# Patient Record
Sex: Male | Born: 1990
Health system: Southern US, Community
[De-identification: ages and names within clinical notes are randomized; demographics above are authoritative.]

## PROBLEM LIST (undated history)

## (undated) DIAGNOSIS — J45909 Unspecified asthma, uncomplicated: Secondary | ICD-10-CM

---
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
## 2013-09-16 ENCOUNTER — Ambulatory Visit: Payer: Self-pay | Admitting: Physician Assistant

## 2014-03-28 ENCOUNTER — Emergency Department: Payer: Self-pay | Admitting: Emergency Medicine

## 2015-02-10 ENCOUNTER — Encounter: Payer: Self-pay | Admitting: *Deleted

## 2015-02-10 ENCOUNTER — Emergency Department
Admission: EM | Admit: 2015-02-10 | Discharge: 2015-02-10 | Disposition: A | Discharge status: Home / Self Care | Payer: Self-pay | Attending: Emergency Medicine | Admitting: Emergency Medicine

## 2015-02-10 DIAGNOSIS — J4521 Mild intermittent asthma with (acute) exacerbation: Secondary | ICD-10-CM | POA: Insufficient documentation

## 2015-02-10 DIAGNOSIS — Z720 Tobacco use: Secondary | ICD-10-CM | POA: Insufficient documentation

## 2015-02-10 HISTORY — DX: Unspecified asthma, uncomplicated: J45.909

## 2015-02-10 MED ORDER — PREDNISONE 10 MG PO TABS: 50.0000 mg | ORAL_TABLET | Freq: Every day | ORAL | Status: DC

## 2015-02-10 MED ORDER — IPRATROPIUM-ALBUTEROL 0.5-2.5 (3) MG/3ML IN SOLN
3.0000 mL | Freq: Once | RESPIRATORY_TRACT | Status: AC
  Administered 2015-02-10: 3 mL via RESPIRATORY_TRACT
  Filled 2015-02-10: qty 3

## 2015-02-10 MED ORDER — CETIRIZINE HCL 10 MG PO CAPS: 10.0000 mg | ORAL_CAPSULE | Freq: Every day | ORAL | Status: DC

## 2015-02-10 MED ORDER — ALBUTEROL SULFATE HFA 108 (90 BASE) MCG/ACT IN AERS: 2.0000 | INHALATION_SPRAY | Freq: Four times a day (QID) | RESPIRATORY_TRACT | Status: DC | PRN

## 2015-02-10 MED ORDER — PREDNISONE 20 MG PO TABS
60.0000 mg | ORAL_TABLET | Freq: Once | ORAL | Status: AC
  Administered 2015-02-10: 60 mg via ORAL
  Filled 2015-02-10: qty 3

## 2015-02-10 NOTE — ED Notes (Signed)
Pt is wheezing.  Began this morning.  Pt out of inhaler

## 2015-02-10 NOTE — ED Provider Notes (Signed)
[facility] Emergency Department Provider Note  ____________________________________________  Time seen: Approximately 8:10 PM  I have reviewed the triage vital signs and the nursing notes.   HISTORY  Chief Complaint Wheezing   HPI Alveria ApleyMichael A Jared Jr. is a 24 y.o. male who presents to the emergency department for evaluation of wheezing. He states the symptoms began at 2 AM and have progressively worsened throughout the day. He has a long-standing history of asthma. He does not have a primary care provider and "never has a refill on the inhalers  from the emergency department."He states the change of season typically brings on these symptoms. He denies pain in his chest.   Past Medical History  Diagnosis Date  . Asthma     There are no active problems to display for this patient.   No past surgical history on file.  Current Outpatient Rx  Name  Route  Sig  Dispense  Refill  . albuterol (PROVENTIL HFA;VENTOLIN HFA) 108 (90 BASE) MCG/ACT inhaler   Inhalation   Inhale 2 puffs into the lungs every 6 (six) hours as needed for wheezing or shortness of breath.   1 Inhaler   2   . Cetirizine HCl 10 MG CAPS   Oral   Take 1 capsule (10 mg total) by mouth daily.   30 capsule   3   . predniSONE (DELTASONE) 10 MG tablet   Oral   Take 5 tablets (50 mg total) by mouth daily.   25 tablet   0     Allergies Review of patient's allergies indicates no known allergies.  History reviewed. No pertinent family history.  Social History Social History  Substance Use Topics  . Smoking status: Current Every Day Smoker    Types: Cigarettes  . Smokeless tobacco: None  . Alcohol Use: No    Review of Systems Constitutional: No fever/chills Eyes: No visual changes. ENT: No sore throat. Cardiovascular: Denies chest pain. Respiratory: Positive for shortness of breath. Positive for cough. Gastrointestinal: Negative for abdominal pain. Negative for nausea,   negative for vomiting.  Negative for diarrhea.  Genitourinary: Negative for dysuria. Musculoskeletal: Negative for body aches Skin: Negative for rash. Neurological: Negative for headaches, Negative for focal weakness or numbness.  10-point ROS otherwise negative.  ____________________________________________   PHYSICAL EXAM:  VITAL SIGNS: ED Triage Vitals  Enc Vitals Group     BP 02/10/15 1941 133/116 mmHg     Pulse Rate 02/10/15 1941 102     Resp 02/10/15 1941 24     Temp 02/10/15 1941 98.1 F (36.7 C)     Temp Source 02/10/15 1941 Oral     SpO2 02/10/15 1941 95 %     Weight 02/10/15 1941 145 lb (65.772 kg)     Height 02/10/15 1941 5\' 10"  (1.778 m)     Head Cir --      Peak Flow --      Pain Score 02/10/15 1943 4     Pain Loc --      Pain Edu? --      Excl. in GC? --     Constitutional: Alert and oriented. Well appearing and in no acute distress. Eyes: Conjunctivae are normal. PERRL. EOMI. Head: Atraumatic. Nose: No congestion/rhinnorhea. Mouth/Throat: Mucous membranes are moist.  Oropharynx non-erythematous. Neck: No stridor.  Lymphatic: No cervical lymphadenopathy. Cardiovascular: Normal rate, regular rhythm. Grossly normal heart sounds.  Good peripheral circulation. Respiratory: Normal respiratory effort.  No retractions. Lungs inspiratory wheezes noted in bilateral  bases, otherwise diminished throughout.. Gastrointestinal: Soft and nontender. No distention. No abdominal bruits. No CVA tenderness. Musculoskeletal: No joint pain reported. Neurologic:  Normal speech and language. No gross focal neurologic deficits are appreciated. Speech is normal. No gait instability. Skin:  Skin is warm, dry and intact. No rash noted. Psychiatric: Mood and affect are normal. Speech and behavior are normal.  ____________________________________________   LABS (all labs ordered are listed, but only abnormal results are displayed)  Labs Reviewed - No data to  display ____________________________________________  EKG   ____________________________________________  RADIOLOGY   ____________________________________________   PROCEDURES  Procedure(s) performed: None  Critical Care performed: No  ____________________________________________   INITIAL IMPRESSION / ASSESSMENT AND PLAN / ED COURSE  Pertinent labs & imaging results that were available during my care of the patient were reviewed by me and considered in my medical decision making (see chart for details).    1 DuoNeb +60 mg of prednisone given while in the emergency department. Lungs clear to auscultation on reassessment. Respirations even and unlabored.  Patient was strongly encouraged to establish primary care provider for further evaluation of asthma. He was advised to return to the emergency department for symptoms that change or worsen if he is unable schedule an appointment. ____________________________________________   FINAL CLINICAL IMPRESSION(S) / ED DIAGNOSES  Final diagnoses:  Asthma, mild intermittent, with acute exacerbation       Chinita Pester, FNP 02/10/15 2128  Jennye Moccasin, MD 02/10/15 2206

## 2015-02-10 NOTE — Discharge Instructions (Signed)
Asthma, Adult Asthma is a recurring condition in which the airways tighten and narrow. Asthma can make it difficult to breathe. It can cause coughing, wheezing, and shortness of breath. Asthma episodes, also called asthma attacks, range from minor to life-threatening. Asthma cannot be cured, but medicines and lifestyle changes can help control it. CAUSES Asthma is believed to be caused by inherited (genetic) and environmental factors, but its exact cause is unknown. Asthma may be triggered by allergens, lung infections, or irritants in the air. Asthma triggers are different for each person. Common triggers include:   Animal dander.  Dust mites.  Cockroaches.  Pollen from trees or grass.  Mold.  Smoke.  Air pollutants such as dust, household cleaners, hair sprays, aerosol sprays, paint fumes, strong chemicals, or strong odors.  Cold air, weather changes, and winds (which increase molds and pollens in the air).  Strong emotional expressions such as crying or laughing hard.  Stress.  Certain medicines (such as aspirin) or types of drugs (such as beta-blockers).  Sulfites in foods and drinks. Foods and drinks that may contain sulfites include dried fruit, potato chips, and sparkling grape juice.  Infections or inflammatory conditions such as the flu, a cold, or an inflammation of the nasal membranes (rhinitis).  Gastroesophageal reflux disease (GERD).  Exercise or strenuous activity. SYMPTOMS Symptoms may occur immediately after asthma is triggered or many hours later. Symptoms include:  Wheezing.  Excessive nighttime or early morning coughing.  Frequent or severe coughing with a common cold.  Chest tightness.  Shortness of breath. DIAGNOSIS  The diagnosis of asthma is made by a review of your medical history and a physical exam. Tests may also be performed. These may include:  Lung function studies. These tests show how much air you breathe in and out.  Allergy  tests.  Imaging tests such as X-rays. TREATMENT  Asthma cannot be cured, but it can usually be controlled. Treatment involves identifying and avoiding your asthma triggers. It also involves medicines. There are 2 classes of medicine used for asthma treatment:   Controller medicines. These prevent asthma symptoms from occurring. They are usually taken every day.  Reliever or rescue medicines. These quickly relieve asthma symptoms. They are used as needed and provide short-term relief. Your health care provider will help you create an asthma action plan. An asthma action plan is a written plan for managing and treating your asthma attacks. It includes a list of your asthma triggers and how they may be avoided. It also includes information on when medicines should be taken and when their dosage should be changed. An action plan may also involve the use of a device called a peak flow meter. A peak flow meter measures how well the lungs are working. It helps you monitor your condition. HOME CARE INSTRUCTIONS   Take medicines only as directed by your health care provider. Speak with your health care provider if you have questions about how or when to take the medicines.  Use a peak flow meter as directed by your health care provider. Record and keep track of readings.  Understand and use the action plan to help minimize or stop an asthma attack without needing to seek medical care.  Control your home environment in the following ways to help prevent asthma attacks:  Do not smoke. Avoid being exposed to secondhand smoke.  Change your heating and air conditioning filter regularly.  Limit your use of fireplaces and wood stoves.  Get rid of pests (such as roaches   and mice) and their droppings.  Throw away plants if you see mold on them.  Clean your floors and dust regularly. Use unscented cleaning products.  Try to have someone else vacuum for you regularly. Stay out of rooms while they are  being vacuumed and for a short while afterward. If you vacuum, use a dust mask from a hardware store, a double-layered or microfilter vacuum cleaner bag, or a vacuum cleaner with a HEPA filter.  Replace carpet with wood, tile, or vinyl flooring. Carpet can trap dander and dust.  Use allergy-proof pillows, mattress covers, and box spring covers.  Wash bed sheets and blankets every week in hot water and dry them in a dryer.  Use blankets that are made of polyester or cotton.  Clean bathrooms and kitchens with bleach. If possible, have someone repaint the walls in these rooms with mold-resistant paint. Keep out of the rooms that are being cleaned and painted.  Wash hands frequently. SEEK MEDICAL CARE IF:   You have wheezing, shortness of breath, or a cough even if taking medicine to prevent attacks.  The colored mucus you cough up (sputum) is thicker than usual.  Your sputum changes from clear or white to yellow, green, gray, or bloody.  You have any problems that may be related to the medicines you are taking (such as a rash, itching, swelling, or trouble breathing).  You are using a reliever medicine more than 2-3 times per week.  Your peak flow is still at 50-79% of your personal best after following your action plan for 1 hour.  You have a fever. SEEK IMMEDIATE MEDICAL CARE IF:   You seem to be getting worse and are unresponsive to treatment during an asthma attack.  You are short of breath even at rest.  You get short of breath when doing very little physical activity.  You have difficulty eating, drinking, or talking due to asthma symptoms.  You develop chest pain.  You develop a fast heartbeat.  You have a bluish color to your lips or fingernails.  You are light-headed, dizzy, or faint.  Your peak flow is less than 50% of your personal best.   This information is not intended to replace advice given to you by your health care provider. Make sure you discuss any  questions you have with your health care provider.   Document Released: 04/10/2005 Document Revised: 12/30/2014 Document Reviewed: 11/07/2012 Elsevier Interactive Patient Education 2016 Elsevier Inc.  

## 2015-10-27 ENCOUNTER — Emergency Department: Payer: BLUE CROSS/BLUE SHIELD

## 2015-10-27 ENCOUNTER — Emergency Department
Admission: EM | Admit: 2015-10-27 | Discharge: 2015-10-27 | Disposition: A | Discharge status: Home / Self Care | Payer: BLUE CROSS/BLUE SHIELD | Attending: Emergency Medicine | Admitting: Emergency Medicine

## 2015-10-27 DIAGNOSIS — J45909 Unspecified asthma, uncomplicated: Secondary | ICD-10-CM | POA: Insufficient documentation

## 2015-10-27 DIAGNOSIS — F17210 Nicotine dependence, cigarettes, uncomplicated: Secondary | ICD-10-CM | POA: Diagnosis not present

## 2015-10-27 DIAGNOSIS — J45901 Unspecified asthma with (acute) exacerbation: Secondary | ICD-10-CM

## 2015-10-27 DIAGNOSIS — Z79899 Other long term (current) drug therapy: Secondary | ICD-10-CM | POA: Diagnosis not present

## 2015-10-27 MED ORDER — PREDNISONE 20 MG PO TABS: 40.0000 mg | ORAL_TABLET | Freq: Every day | ORAL | Status: DC

## 2015-10-27 MED ORDER — IPRATROPIUM-ALBUTEROL 0.5-2.5 (3) MG/3ML IN SOLN
3.0000 mL | Freq: Once | RESPIRATORY_TRACT | Status: AC
  Administered 2015-10-27: 3 mL via RESPIRATORY_TRACT
  Filled 2015-10-27: qty 3

## 2015-10-27 MED ORDER — PREDNISONE 20 MG PO TABS
60.0000 mg | ORAL_TABLET | Freq: Once | ORAL | Status: AC
  Administered 2015-10-27: 60 mg via ORAL
  Filled 2015-10-27: qty 3

## 2015-10-27 MED ORDER — ALBUTEROL SULFATE HFA 108 (90 BASE) MCG/ACT IN AERS: 2.0000 | INHALATION_SPRAY | Freq: Four times a day (QID) | RESPIRATORY_TRACT | Status: AC | PRN

## 2015-10-27 NOTE — Discharge Instructions (Signed)
Asthma, Acute Bronchospasm °Acute bronchospasm caused by asthma is also referred to as an asthma attack. Bronchospasm means your air passages become narrowed. The narrowing is caused by inflammation and tightening of the muscles in the air tubes (bronchi) in your lungs. This can make it hard to breathe or cause you to wheeze and cough. °CAUSES °Possible triggers are: °· Animal dander from the skin, hair, or feathers of animals. °· Dust mites contained in house dust. °· Cockroaches. °· Pollen from trees or grass. °· Mold. °· Cigarette or tobacco smoke. °· Air pollutants such as dust, household cleaners, hair sprays, aerosol sprays, paint fumes, strong chemicals, or strong odors. °· Cold air or weather changes. Cold air may trigger inflammation. Winds increase molds and pollens in the air. °· Strong emotions such as crying or laughing hard. °· Stress. °· Certain medicines such as aspirin or beta-blockers. °· Sulfites in foods and drinks, such as dried fruits and wine. °· Infections or inflammatory conditions, such as a flu, cold, or inflammation of the nasal membranes (rhinitis). °· Gastroesophageal reflux disease (GERD). GERD is a condition where stomach acid backs up into your esophagus. °· Exercise or strenuous activity. °SIGNS AND SYMPTOMS  °· Wheezing. °· Excessive coughing, particularly at night. °· Chest tightness. °· Shortness of breath. °DIAGNOSIS  °Your health care provider will ask you about your medical history and perform a physical exam. A chest X-ray or blood testing may be performed to look for other causes of your symptoms or other conditions that may have triggered your asthma attack.  °TREATMENT  °Treatment is aimed at reducing inflammation and opening up the airways in your lungs.  Most asthma attacks are treated with inhaled medicines. These include quick relief or rescue medicines (such as bronchodilators) and controller medicines (such as inhaled corticosteroids). These medicines are sometimes  given through an inhaler or a nebulizer. Systemic steroid medicine taken by mouth or given through an IV tube also can be used to reduce the inflammation when an attack is moderate or severe. Antibiotic medicines are only used if a bacterial infection is present.  °HOME CARE INSTRUCTIONS  °· Rest. °· Drink plenty of liquids. This helps the mucus to remain thin and be easily coughed up. Only use caffeine in moderation and do not use alcohol until you have recovered from your illness. °· Do not smoke. Avoid being exposed to secondhand smoke. °· You play a critical role in keeping yourself in good health. Avoid exposure to things that cause you to wheeze or to have breathing problems. °· Keep your medicines up-to-date and available. Carefully follow your health care provider's treatment plan. °· Take your medicine exactly as prescribed. °· When pollen or pollution is bad, keep windows closed and use an air conditioner or go to places with air conditioning. °· Asthma requires careful medical care. See your health care provider for a follow-up as advised. If you are more than [redacted] weeks pregnant and you were prescribed any new medicines, let your obstetrician know about the visit and how you are doing. Follow up with your health care provider as directed. °· After you have recovered from your asthma attack, make an appointment with your outpatient doctor to talk about ways to reduce the likelihood of future attacks. If you do not have a doctor who manages your asthma, make an appointment with a primary care doctor to discuss your asthma. °SEEK IMMEDIATE MEDICAL CARE IF:  °· You are getting worse. °· You have trouble breathing. If severe, call your local   emergency services (911 in the [place]  You develop chest pain or discomfort.  You are vomiting.  You are not able to keep fluids down.  You are coughing up yellow, green, brown, or bloody sputum.  You have a fever and your symptoms suddenly get worse.  You have  trouble swallowing. MAKE SURE YOU:   Understand these instructions.  Will watch your condition.  Will get help right away if you are not doing well or get worse.   This information is not intended to replace advice given to you by your health care provider. Make sure you discuss any questions you have with your health care provider.   Document Released: 07/26/2006 Document Revised: 04/15/2013 Document Reviewed: 10/16/2012 Elsevier Interactive Patient Education [employer]2016 Elsevier Inc.  Please return immediately if condition worsens. Please contact her primary physician or the physician you were given for referral. If you have any specialist physicians involved in her treatment and plan please also contact them. Thank you for using [place] [facility].

## 2015-10-27 NOTE — ED Provider Notes (Signed)
Time Seen: Approximately 0 740*  I have reviewed the triage notes  Chief Complaint: Asthma and Emesis   History of Present Illness: Mark ApleyMichael A Sharma Jr. is a 25 y.o. male *who presents with a history of asthma. Patient normally has an inhaler at home and states he ran out several days ago. The patient also states he's developed some poison ivy and apparently was working outside and "" heart "" a tree that had poison ivy on it and describes it as being around both of his inner thigh area toward his genitalia. Patient denies any fever, chills or productive cough. He goes on to state. Couple of days ago he finished up a three-day course of nausea and vomiting. Mild diffuse chest discomfort mainly with movement. He denies any productive nature to his cough. He denies any calf tenderness or swelling or any strong pulmonary emboli risk factors.  Past Medical History  Diagnosis Date  . Asthma     There are no active problems to display for this patient.   History reviewed. No pertinent past surgical history.  History reviewed. No pertinent past surgical history.  Current Outpatient Rx  Name  Route  Sig  Dispense  Refill  . Cetirizine HCl 10 MG CAPS   Oral   Take 1 capsule (10 mg total) by mouth daily.   30 capsule   3   . albuterol (PROVENTIL HFA;VENTOLIN HFA) 108 (90 Base) MCG/ACT inhaler   Inhalation   Inhale 2 puffs into the lungs every 6 (six) hours as needed for wheezing or shortness of breath.   1 Inhaler   2   . predniSONE (DELTASONE) 20 MG tablet   Oral   Take 2 tablets (40 mg total) by mouth daily.   10 tablet   0     Allergies:  Review of patient's allergies indicates no known allergies.  Family History: No family history on file.  Social History: Social History  Substance Use Topics  . Smoking status: Current Every Day Smoker    Types: Cigarettes  . Smokeless tobacco: None  . Alcohol Use: No     Review of Systems:   10 point review of systems was  performed and was otherwise negative:  Constitutional: No fever Eyes: No visual disturbances ENT: No sore throat, ear pain Cardiac: No chest pain Respiratory: Mild shortness of breath, without wheezing, or stridor Abdomen: No abdominal pain, no vomiting, No diarrhea Endocrine: No weight loss, No night sweats Extremities: No peripheral edema, cyanosis Skin: Pruritic rash on the inner surface of both eyes Neurologic: No focal weakness, trouble with speech or swollowing Urologic: No dysuria, Hematuria, or urinary frequency   Physical Exam:  ED Triage Vitals  Enc Vitals Group     BP 10/27/15 0734 126/81 mmHg     Pulse Rate 10/27/15 0732 63     Resp 10/27/15 0732 18     Temp 10/27/15 0732 97.5 F (36.4 C)     Temp Source 10/27/15 0732 Oral     SpO2 10/27/15 0732 96 %     Weight 10/27/15 0732 150 lb (68.04 kg)     Height 10/27/15 0732 5\' 10"  (1.778 m)     Head Cir --      Peak Flow --      Pain Score 10/27/15 0733 6     Pain Loc --      Pain Edu? --      Excl. in GC? --     General: Awake ,  Alert , and Oriented times 3; GCS 15No signs respiratory distress Head: Normal cephalic , atraumatic Eyes: Pupils equal , round, reactive to light Nose/Throat: No nasal drainage, patent upper airway without erythema or exudate.  Neck: Supple, Full range of motion, No anterior adenopathy or palpable thyroid masses Lungs: Clear to ascultation mild and expiratory wheezes , no rhonchi, or rales Heart: Regular rate, regular rhythm without murmurs , gallops , or rubs Abdomen: Soft, non tender without rebound, guarding , or rigidity; bowel sounds positive and symmetric in all 4 quadrants. No organomegaly .        Extremities: 2 plus symmetric pulses. No edema, clubbing or cyanosis Neurologic: normal ambulation, Motor symmetric without deficits, sensory intact Skin: Rash consistent with poison ivy    Radiology:      DG Chest 2 View (Final result) Result time: 10/27/15 08:10:26   Final  result by Rad Results In Interface (10/27/15 08:10:26)   Narrative:   CLINICAL DATA: History of asthma, chest tightness shortness of breath and cough following an asthma attack this morning, former smoker.  EXAM: CHEST 2 VIEW  COMPARISON: Chest x-ray of March 28, 2014  FINDINGS: The lungs are mildly hyperinflated without hemidiaphragm flattening. There is no focal infiltrate. There is no pneumothorax or pleural effusion. The heart and pulmonary vascularity are normal. The mediastinum is normal in width. The bony thorax exhibits no acute abnormality. There is stable mild reverse S-shaped thoracic scoliosis.  IMPRESSION: Mild hyperinflation. No pneumonia nor other acute cardiopulmonary abnormality.   Electronically Signed By: David SwazilandJordan M.D. On: 10/27/2015 08:10         I personally reviewed the radiologic studies    ED Course: * Patient's stay was uneventful he received one DuoNeb and felt improvement and repeat exam showed very limited wheezing at this time. Pulse oximetry is stable. I felt laboratory work was not necessary. Patient was also started on steroid therapy with prednisone. He'll be prescribed prednisone on an outpatient basis. He is advised drink plenty of fluids and aligned himself with primary care on an outpatient basis.    Assessment: Contact dermatitis Acute exacerbation of chronic asthma  Final Clinical Impression: *  Final diagnoses:  Asthma attack     Plan: * Outpatient Prescription for prednisone and albuterol inhaler Patient was advised to return immediately if condition worsens. Patient was advised to follow up with their primary care physician or other specialized physicians involved in their outpatient care. The patient and/or family member/power of attorney had laboratory results reviewed at the bedside. All questions and concerns were addressed and appropriate discharge instructions were distributed by the nursing  staff.            Jennye MoccasinBrian S Ashlie Mcmenamy, MD 10/27/15 [phone]

## 2015-10-27 NOTE — ED Notes (Addendum)
Pt c/o asthma flare since yesterday. States he is out of his inhalers. States he has had a cough with congestion for the past 3 days. N/V for the past 3 days

## 2015-10-27 NOTE — ED Notes (Signed)
MD in room to re-assess patient.  Will continue to monitor.   

## 2015-12-06 IMAGING — CR DG CHEST 2V
1 series · 2 of 2 positions shown · non-contrast
Comparison: None.

CLINICAL DATA: Initial evaluation for cough, asthma for past 2
weeks, shortness of breath, no fever

EXAM:
CHEST  2 VIEW

[Series 1: dxr chest pa (or ap) and lateral · 0.14mm/px · 2 of 2 slices shown]
[im 1/2]
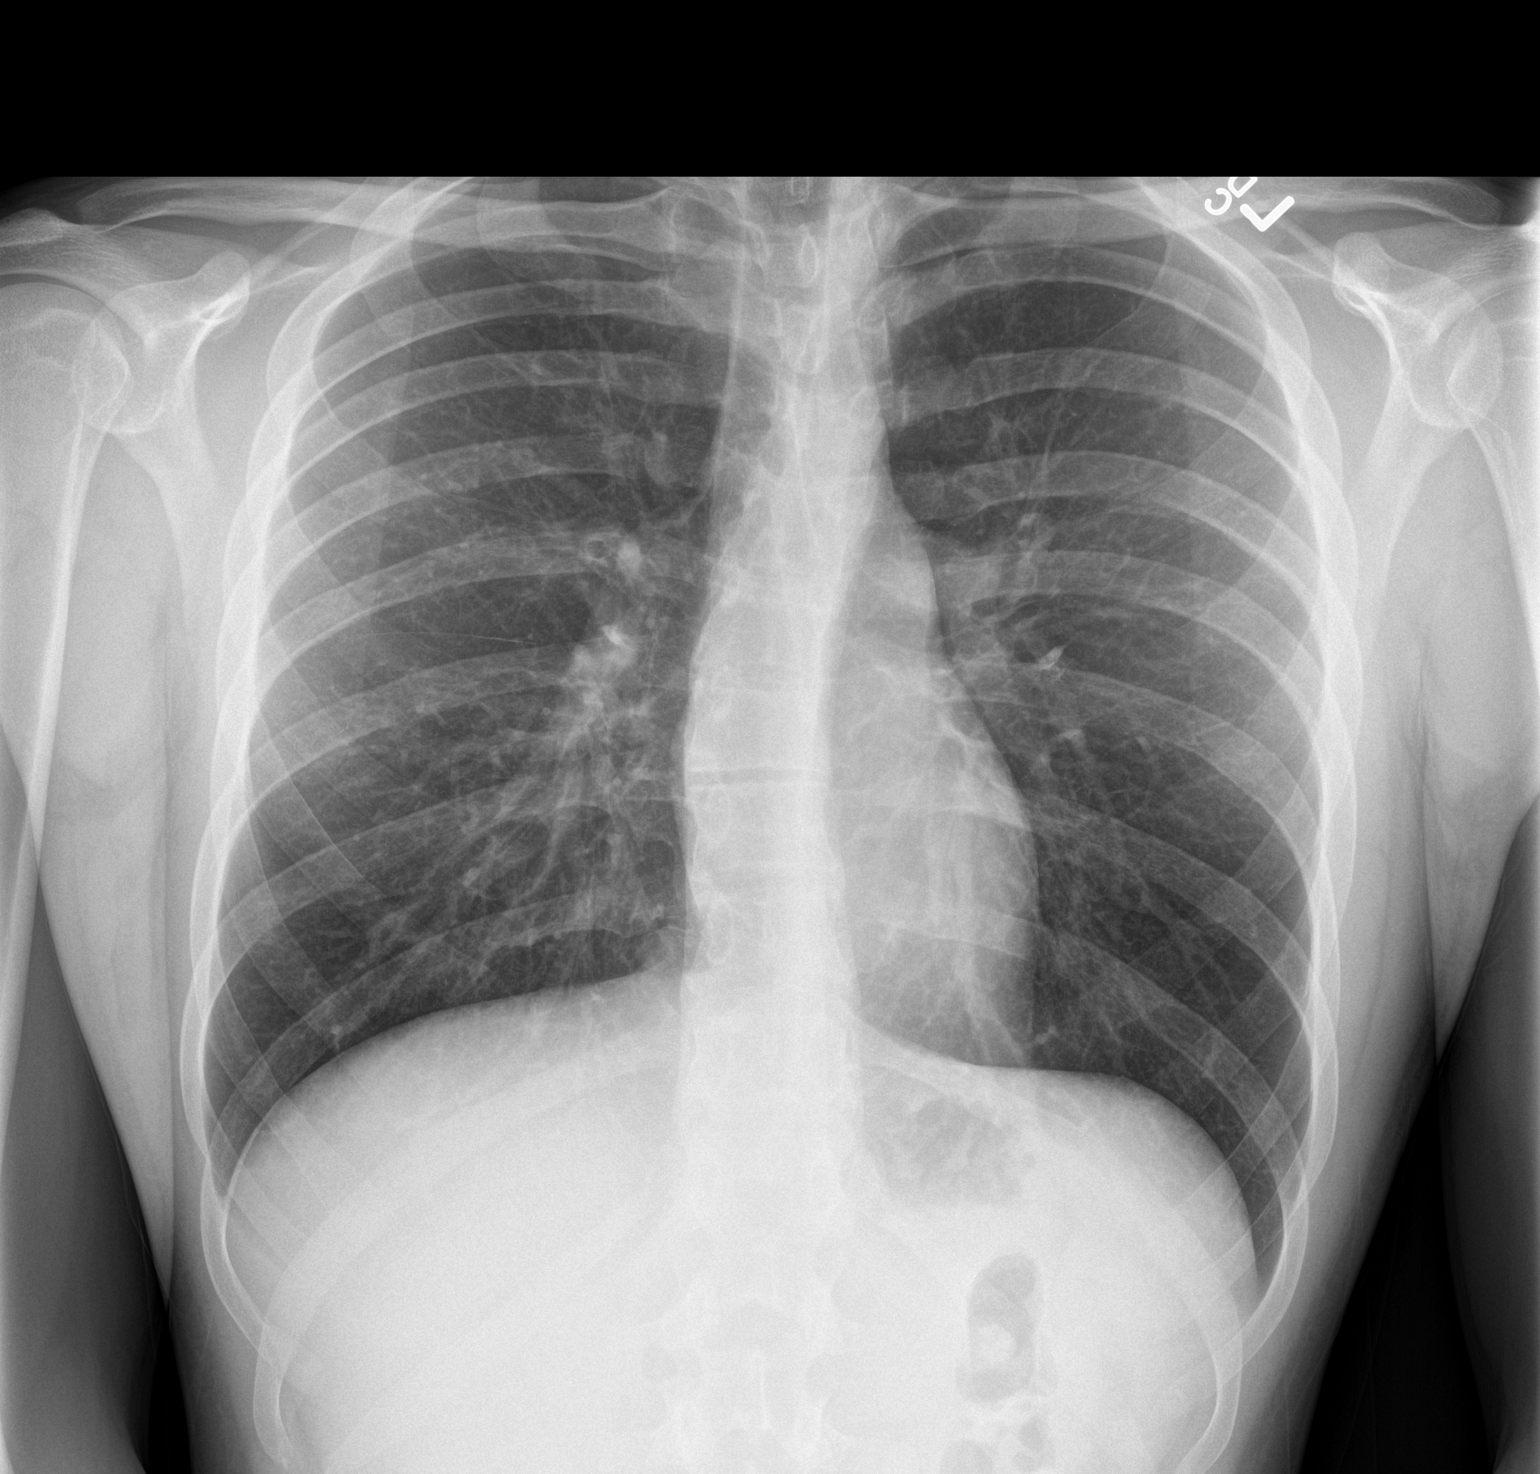
[im 2/2]
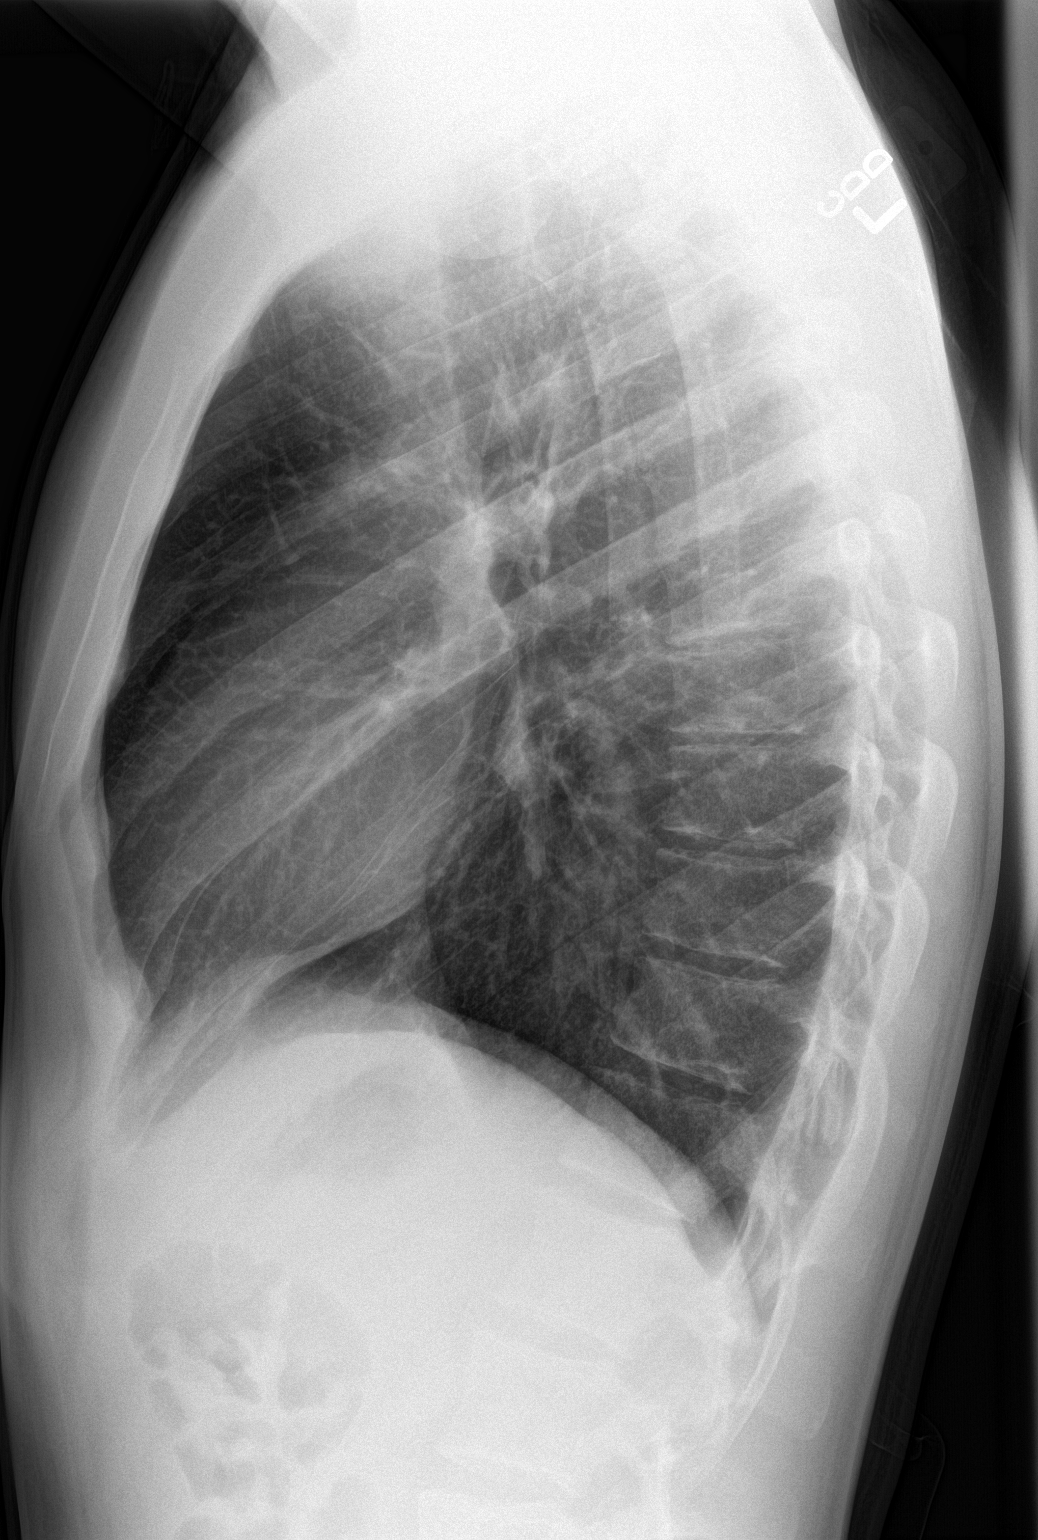

[2 of 2 positions shown; findings below may reference images not displayed]

FINDINGS: Heart size and vascular pattern normal. Lungs are clear. No
effusion. Bony thorax intact. Mild to moderate sigmoid scoliotic
curvature of the thoracic spine noted.
IMPRESSION: No active cardiopulmonary disease.

## 2015-12-31 ENCOUNTER — Encounter: Payer: Self-pay | Admitting: *Deleted

## 2015-12-31 ENCOUNTER — Emergency Department
Admission: EM | Admit: 2015-12-31 | Discharge: 2015-12-31 | Disposition: A | Discharge status: Home / Self Care | Payer: BLUE CROSS/BLUE SHIELD | Attending: Emergency Medicine | Admitting: Emergency Medicine

## 2015-12-31 ENCOUNTER — Emergency Department: Payer: BLUE CROSS/BLUE SHIELD

## 2015-12-31 DIAGNOSIS — Z5321 Procedure and treatment not carried out due to patient leaving prior to being seen by health care provider: Secondary | ICD-10-CM | POA: Diagnosis not present

## 2015-12-31 DIAGNOSIS — R062 Wheezing: Secondary | ICD-10-CM | POA: Insufficient documentation

## 2015-12-31 DIAGNOSIS — Z87891 Personal history of nicotine dependence: Secondary | ICD-10-CM | POA: Insufficient documentation

## 2015-12-31 MED ORDER — IPRATROPIUM-ALBUTEROL 0.5-2.5 (3) MG/3ML IN SOLN
3.0000 mL | Freq: Once | RESPIRATORY_TRACT | Status: AC
  Administered 2015-12-31: 3 mL via RESPIRATORY_TRACT

## 2015-12-31 MED ORDER — IPRATROPIUM-ALBUTEROL 0.5-2.5 (3) MG/3ML IN SOLN
RESPIRATORY_TRACT | Status: AC
  Filled 2015-12-31: qty 3

## 2015-12-31 NOTE — ED Notes (Signed)
Pt states breathing better after breathing treatment in triage.

## 2015-12-31 NOTE — ED Notes (Signed)
Pt not found in lobby 

## 2015-12-31 NOTE — ED Triage Notes (Signed)
Pt reports wheezing tonight.  Hx of asthma.  States out of inhaler for 3 weeks.

## 2016-05-22 ENCOUNTER — Encounter: Payer: Self-pay | Admitting: Emergency Medicine

## 2016-05-22 ENCOUNTER — Emergency Department
Admission: EM | Admit: 2016-05-22 | Discharge: 2016-05-22 | Disposition: A | Discharge status: Home / Self Care | Payer: PRIVATE HEALTH INSURANCE | Attending: Emergency Medicine | Admitting: Emergency Medicine

## 2016-05-22 ENCOUNTER — Emergency Department: Payer: PRIVATE HEALTH INSURANCE

## 2016-05-22 DIAGNOSIS — Z87891 Personal history of nicotine dependence: Secondary | ICD-10-CM | POA: Insufficient documentation

## 2016-05-22 DIAGNOSIS — J45909 Unspecified asthma, uncomplicated: Secondary | ICD-10-CM | POA: Insufficient documentation

## 2016-05-22 DIAGNOSIS — Y939 Activity, unspecified: Secondary | ICD-10-CM | POA: Insufficient documentation

## 2016-05-22 DIAGNOSIS — W2203XA Walked into furniture, initial encounter: Secondary | ICD-10-CM | POA: Insufficient documentation

## 2016-05-22 DIAGNOSIS — Y999 Unspecified external cause status: Secondary | ICD-10-CM | POA: Insufficient documentation

## 2016-05-22 DIAGNOSIS — Y929 Unspecified place or not applicable: Secondary | ICD-10-CM | POA: Insufficient documentation

## 2016-05-22 DIAGNOSIS — Z79899 Other long term (current) drug therapy: Secondary | ICD-10-CM | POA: Insufficient documentation

## 2016-05-22 DIAGNOSIS — F4310 Post-traumatic stress disorder, unspecified: Secondary | ICD-10-CM | POA: Insufficient documentation

## 2016-05-22 DIAGNOSIS — S60221A Contusion of right hand, initial encounter: Secondary | ICD-10-CM

## 2016-05-22 MED ORDER — NAPROXEN 500 MG PO TABS
500.0000 mg | ORAL_TABLET | Freq: Once | ORAL | Status: AC
  Administered 2016-05-22: 500 mg via ORAL
  Filled 2016-05-22: qty 1

## 2016-05-22 MED ORDER — NAPROXEN 500 MG PO TABS
500.0000 mg | ORAL_TABLET | Freq: Two times a day (BID) | ORAL | 0 refills | Status: DC
Start: 2016-05-22 — End: 2020-09-08

## 2016-05-22 MED ORDER — TRAMADOL HCL 50 MG PO TABS
50.0000 mg | ORAL_TABLET | Freq: Once | ORAL | Status: AC
  Administered 2016-05-22: 50 mg via ORAL
  Filled 2016-05-22: qty 1

## 2016-05-22 NOTE — ED Triage Notes (Signed)
Pt reports right hand swelling and pain, reports doing hot water soaks and taking extra strength aspirin with no relief. Swelling noted.

## 2016-05-22 NOTE — Discharge Instructions (Signed)
Wear splint for 3-5 days as needed. °

## 2016-05-22 NOTE — ED Provider Notes (Signed)
[facility] Emergency Department Provider Note   ____________________________________________   First MD Initiated Contact with Patient 05/22/16 [phone]     (approximate)  I have reviewed the triage vital signs and the nursing notes.   HISTORY  Chief Complaint Hand Injury    HPI Mark ApleyMichael A Maffei Jr. is a 26 y.o. male patient complaining of right hand pain for 1 week. Patient state he believes he hit his hand in his sleep against the bedpost.Patient state swelling has continued. Patient denies any other provocative incident for his complaint. Patient rates his pain as a 4/10. Patient state only mild relief with extra aspirin. He is right-hand dominant. Patient denies loss sensation or loss of function.   Past Medical History:  Diagnosis Date  . Asthma     There are no active problems to display for this patient.   No past surgical history on file.  Prior to Admission medications   Medication Sig Start Date End Date Taking? Authorizing Provider  albuterol (PROVENTIL HFA;VENTOLIN HFA) 108 (90 Base) MCG/ACT inhaler Inhale 2 puffs into the lungs every 6 (six) hours as needed for wheezing or shortness of breath. 10/27/15   Jennye MoccasinBrian S Quigley, MD  Cetirizine HCl 10 MG CAPS Take 1 capsule (10 mg total) by mouth daily. 02/10/15   Chinita Pesterari B Triplett, FNP  naproxen (NAPROSYN) 500 MG tablet Take 1 tablet (500 mg total) by mouth 2 (two) times daily with a meal. 05/22/16   Joni Reiningonald K Larkin Alfred, PA-C  predniSONE (DELTASONE) 20 MG tablet Take 2 tablets (40 mg total) by mouth daily. 10/27/15   Jennye MoccasinBrian S Quigley, MD    Allergies Patient has no known allergies.  No family history on file.  Social History Social History  Substance Use Topics  . Smoking status: Former Smoker    Types: Cigarettes  . Smokeless tobacco: Never Used  . Alcohol use No    Review of Systems Constitutional: No fever/chills Eyes: No visual changes. ENT: No sore throat. Cardiovascular: Denies chest  pain. Respiratory: Denies shortness of breath. Gastrointestinal: No abdominal pain.  No nausea, no vomiting.  No diarrhea.  No constipation. Genitourinary: Negative for dysuria. Musculoskeletal: Right hand pain and edema. Skin: Negative for rash. Neurological: Negative for headaches, focal weakness or numbness. Psychiatric:PTSD  ____________________________________________   PHYSICAL EXAM:  VITAL SIGNS: ED Triage Vitals  Enc Vitals Group     BP 05/22/16 0829 134/60     Pulse Rate 05/22/16 0829 80     Resp 05/22/16 0829 16     Temp 05/22/16 0829 98.6 F (37 C)     Temp Source 05/22/16 0829 Oral     SpO2 05/22/16 0829 96 %     Weight 05/22/16 0830 145 lb (65.8 kg)     Height 05/22/16 0830 5\' 9"  (1.753 m)     Head Circumference --      Peak Flow --      Pain Score 05/22/16 0818 4     Pain Loc --      Pain Edu? --      Excl. in GC? --     Constitutional: Alert and oriented. Well appearing and in no acute distress. Eyes: Conjunctivae are normal. PERRL. EOMI. Head: Atraumatic. Nose: No congestion/rhinnorhea. Mouth/Throat: Mucous membranes are moist.  Oropharynx non-erythematous. Neck: No stridor.  No cervical spine tenderness to palpation. Hematological/Lymphatic/Immunilogical: No cervical lymphadenopathy. Cardiovascular: Normal rate, regular rhythm. Grossly normal heart sounds.  Good peripheral circulation. Respiratory: Normal respiratory effort.  No retractions. Lungs  CTAB. Gastrointestinal: Soft and nontender. No distention. No abdominal bruits. No CVA tenderness. Musculoskeletal: No lower extremity tenderness nor edema.  No joint effusions. Neurologic:  Normal speech and language. No gross focal neurologic deficits are appreciated. No gait instability. Skin:  Skin is warm, dry and intact. No rash noted. Psychiatric: Mood and affect are normal. Speech and behavior are normal.  ____________________________________________   LABS (all labs ordered are listed, but only  abnormal results are displayed)  Labs Reviewed - No data to display ____________________________________________  EKG   ____________________________________________  RADIOLOGY No acute findings except for soft tissue edema.  ____________________________________________   PROCEDURES  Procedure(s) performed: None  Procedures  Critical Care performed: No  ____________________________________________   INITIAL IMPRESSION / ASSESSMENT AND PLAN / ED COURSE  Pertinent labs & imaging results that were available during my care of the patient were reviewed by me and considered in my medical decision making (see chart for details).  Right hand contusion. Patient given discharge care instructions. Patient placed in a volar hand splint. Patient given prescription for naproxen.      ____________________________________________   FINAL CLINICAL IMPRESSION(S) / ED DIAGNOSES Patient complaining right hand pain secondary to a contusion. Discussed negative x-ray findings except for soft tissue edema. Patient placed in a volar splint and given anti-inflammatory medications. Patient advised follow-up with family doctor if condition persists. Final diagnoses:  Contusion of right hand, initial encounter      NEW MEDICATIONS STARTED DURING THIS VISIT:  New Prescriptions   NAPROXEN (NAPROSYN) 500 MG TABLET    Take 1 tablet (500 mg total) by mouth 2 (two) times daily with a meal.     Note:  This document was prepared using Dragon voice recognition software and may include unintentional dictation errors.    Joni Reining, PA-C 05/22/16 1610    Jennye Moccasin, MD 05/26/16 [phone]

## 2016-05-22 NOTE — ED Notes (Signed)
See triage note  Not sure if he hit his hand during his sleep about a week ago  Right hand swollen

## 2017-06-01 ENCOUNTER — Emergency Department
Admission: EM | Admit: 2017-06-01 | Discharge: 2017-06-01 | Disposition: A | Discharge status: Home / Self Care | Payer: PRIVATE HEALTH INSURANCE | Attending: Emergency Medicine | Admitting: Emergency Medicine

## 2017-06-01 ENCOUNTER — Other Ambulatory Visit: Payer: Self-pay

## 2017-06-01 ENCOUNTER — Encounter: Payer: Self-pay | Admitting: Emergency Medicine

## 2017-06-01 DIAGNOSIS — Z79899 Other long term (current) drug therapy: Secondary | ICD-10-CM | POA: Insufficient documentation

## 2017-06-01 DIAGNOSIS — J111 Influenza due to unidentified influenza virus with other respiratory manifestations: Secondary | ICD-10-CM | POA: Insufficient documentation

## 2017-06-01 DIAGNOSIS — Z87891 Personal history of nicotine dependence: Secondary | ICD-10-CM | POA: Insufficient documentation

## 2017-06-01 DIAGNOSIS — R69 Illness, unspecified: Secondary | ICD-10-CM

## 2017-06-01 DIAGNOSIS — J45909 Unspecified asthma, uncomplicated: Secondary | ICD-10-CM | POA: Insufficient documentation

## 2017-06-01 LAB — INFLUENZA PANEL BY PCR (TYPE A & B)
INFLBPCR: NEGATIVE
Influenza A By PCR: NEGATIVE

## 2017-06-01 MED ORDER — ONDANSETRON HCL 4 MG/2ML IJ SOLN
4.0000 mg | Freq: Once | INTRAMUSCULAR | Status: AC
  Administered 2017-06-01: 4 mg via INTRAVENOUS
  Filled 2017-06-01: qty 2

## 2017-06-01 MED ORDER — SODIUM CHLORIDE 0.9 % IV BOLUS (SEPSIS)
1000.0000 mL | Freq: Once | INTRAVENOUS | Status: AC
  Administered 2017-06-01: 1000 mL via INTRAVENOUS

## 2017-06-01 MED ORDER — ONDANSETRON HCL 4 MG PO TABS: 4.0000 mg | ORAL_TABLET | Freq: Every day | ORAL | 0 refills | Status: AC | PRN

## 2017-06-01 MED ORDER — LIDOCAINE VISCOUS 2 % MT SOLN: 10.0000 mL | OROMUCOSAL | 0 refills | Status: AC | PRN

## 2017-06-01 MED ORDER — FLUTICASONE PROPIONATE 50 MCG/ACT NA SUSP: 2.0000 | Freq: Every day | NASAL | 0 refills | Status: AC

## 2017-06-01 NOTE — ED Triage Notes (Signed)
Pt with flu like sx.  

## 2017-06-01 NOTE — ED Provider Notes (Signed)
[facility] Emergency Department Provider Note  ____________________________________________  Time seen: Approximately 12:42 PM  I have reviewed the triage vital signs and the nursing notes.   HISTORY  Chief Complaint Generalized Body Aches    HPI Mark ApleyMichael A Howson Jr. is a 27 y.o. male that presents emergency department for evaluation of headache, nasal congestion, sore throat, body aches, vomiting for 2 days.  Patient states that he is having trouble keeping down liquids due to vomiting.  He has not having any abdominal pain. No sick contacts.  No cough, shortness of breath, abdominal pain, diarrhea, constipation.   Past Medical History:  Diagnosis Date  . Asthma     There are no active problems to display for this patient.   History reviewed. No pertinent surgical history.  Prior to Admission medications   Medication Sig Start Date End Date Taking? Authorizing Provider  albuterol (PROVENTIL HFA;VENTOLIN HFA) 108 (90 Base) MCG/ACT inhaler Inhale 2 puffs into the lungs every 6 (six) hours as needed for wheezing or shortness of breath. 10/27/15   Jennye MoccasinQuigley, Brian S, MD  Cetirizine HCl 10 MG CAPS Take 1 capsule (10 mg total) by mouth daily. 02/10/15   Triplett, Cari B, FNP  fluticasone (FLONASE) 50 MCG/ACT nasal spray Place 2 sprays into both nostrils daily. 06/01/17 06/01/18  Enid DerryWagner, Aireana Ryland, PA-C  lidocaine (XYLOCAINE) 2 % solution Use as directed 10 mLs in the mouth or throat as needed for mouth pain. 06/01/17   Enid DerryWagner, Jhonny Calixto, PA-C  naproxen (NAPROSYN) 500 MG tablet Take 1 tablet (500 mg total) by mouth 2 (two) times daily with a meal. 05/22/16   Joni ReiningSmith, Ronald K, PA-C  ondansetron (ZOFRAN) 4 MG tablet Take 1 tablet (4 mg total) by mouth daily as needed for nausea or vomiting. 06/01/17 06/01/18  Enid DerryWagner, Janesha Brissette, PA-C  predniSONE (DELTASONE) 20 MG tablet Take 2 tablets (40 mg total) by mouth daily. 10/27/15   Jennye MoccasinQuigley, Brian S, MD    Allergies Patient has no known  allergies.  No family history on file.  Social History Social History   Tobacco Use  . Smoking status: Former Smoker    Types: Cigarettes  . Smokeless tobacco: Never Used  Substance Use Topics  . Alcohol use: No  . Drug use: Not on file     Review of Systems  Eyes: No visual changes. No discharge. ENT: Positive for congestion and rhinorrhea. Cardiovascular: No chest pain. Respiratory: Negative for cough. No SOB. Gastrointestinal: No abdominal pain.  Positive for nausea and vomiting.  No diarrhea.  No constipation. Musculoskeletal: Positive for body aches. Skin: Negative for rash, abrasions, lacerations, ecchymosis. Neurological: Positive for headache.   ____________________________________________   PHYSICAL EXAM:  VITAL SIGNS: ED Triage Vitals  Enc Vitals Group     BP 06/01/17 1046 129/65     Pulse Rate 06/01/17 1046 92     Resp 06/01/17 1046 18     Temp 06/01/17 1046 99.5 F (37.5 C)     Temp Source 06/01/17 1046 Oral     SpO2 06/01/17 1046 95 %     Weight 06/01/17 1025 145 lb (65.8 kg)     Height --      Head Circumference --      Peak Flow --      Pain Score 06/01/17 1025 5     Pain Loc --      Pain Edu? --      Excl. in GC? --      Constitutional: Alert and oriented.  Well appearing and in no acute distress. Eyes: Conjunctivae are normal. PERRL. EOMI. No discharge. Head: Atraumatic. ENT: No frontal and maxillary sinus tenderness.      Ears: Tympanic membranes pearly gray with good landmarks. No discharge.      Nose: Mild congestion/rhinnorhea.      Mouth/Throat: Mucous membranes are moist. Oropharynx non-erythematous. Tonsils not enlarged. No exudates. Uvula midline. Neck: No stridor.   Hematological/Lymphatic/Immunilogical: No cervical lymphadenopathy. Cardiovascular: Normal rate, regular rhythm.  Good peripheral circulation. Respiratory: Normal respiratory effort without tachypnea or retractions. Lungs CTAB. Good air entry to the bases with no  decreased or absent breath sounds. Gastrointestinal: Bowel sounds 4 quadrants. Soft and nontender to palpation. No guarding or rigidity. No palpable masses. No distention. Musculoskeletal: Full range of motion to all extremities. No gross deformities appreciated. Neurologic:  Normal speech and language. No gross focal neurologic deficits are appreciated.  Skin:  Skin is warm, dry and intact. No rash noted.   ____________________________________________   LABS (all labs ordered are listed, but only abnormal results are displayed)  Labs Reviewed  INFLUENZA PANEL BY PCR (TYPE A & B)   ____________________________________________  EKG   ____________________________________________  RADIOLOGY   No results found.  ____________________________________________    PROCEDURES  Procedure(s) performed:    Procedures    Medications  sodium chloride 0.9 % bolus 1,000 mL (0 mLs Intravenous Stopped 06/01/17 1407)  ondansetron (ZOFRAN) injection 4 mg (4 mg Intravenous Given 06/01/17 1241)     ____________________________________________   INITIAL IMPRESSION / ASSESSMENT AND PLAN / ED COURSE  Pertinent labs & imaging results that were available during my care of the patient were reviewed by me and considered in my medical decision making (see chart for details).  Review of the [place] CSRS was performed in accordance of the [facility] prior to dispensing any controlled drugs.   Patient's diagnosis is consistent with influenza-like illness. Vital signs and exam are reassuring.  Influenza tests are negative.  Patient was given IV fluids and IV Zofran.  Patient felt better after medication.  He was able to eat graham crackers and drink [place] without vomiting.  Patient feels comfortable going home. Patient will be discharged home with prescriptions for Zofran and Flonase. Patient is to follow up with PCP as needed or otherwise directed. Patient is given ED precautions to return to the ED for any  worsening or new symptoms.     ____________________________________________  FINAL CLINICAL IMPRESSION(S) / ED DIAGNOSES  Final diagnoses:  Influenza-like illness      NEW MEDICATIONS STARTED DURING THIS VISIT:  ED Discharge Orders        Ordered    ondansetron (ZOFRAN) 4 MG tablet  Daily PRN     06/01/17 1358    fluticasone (FLONASE) 50 MCG/ACT nasal spray  Daily     06/01/17 1358    lidocaine (XYLOCAINE) 2 % solution  As needed     06/01/17 1358          This chart was dictated using voice recognition software/Dragon. Despite best efforts to proofread, errors can occur which can change the meaning. Any change was purely unintentional.    Enid Derry, PA-C 06/01/17 1542    Governor Rooks, MD 06/02/17 [phone]

## 2017-06-01 NOTE — ED Notes (Signed)
See triage note  Presents with body aches ,fever and some n/v   states sx's started 2 days ago   Low grade fever noted on arrival   States last time vomited was about 1 hr PTA

## 2017-09-09 IMAGING — CR DG CHEST 2V
1 series · 2 of 2 positions shown · non-contrast
Comparison: Chest radiograph 10/27/2015

CLINICAL DATA: Wheezing.

EXAM:
CHEST  2 VIEW

[Series 1: w chest pa · 0.14mm/px · 2 of 2 slices shown]
[im 1/2]
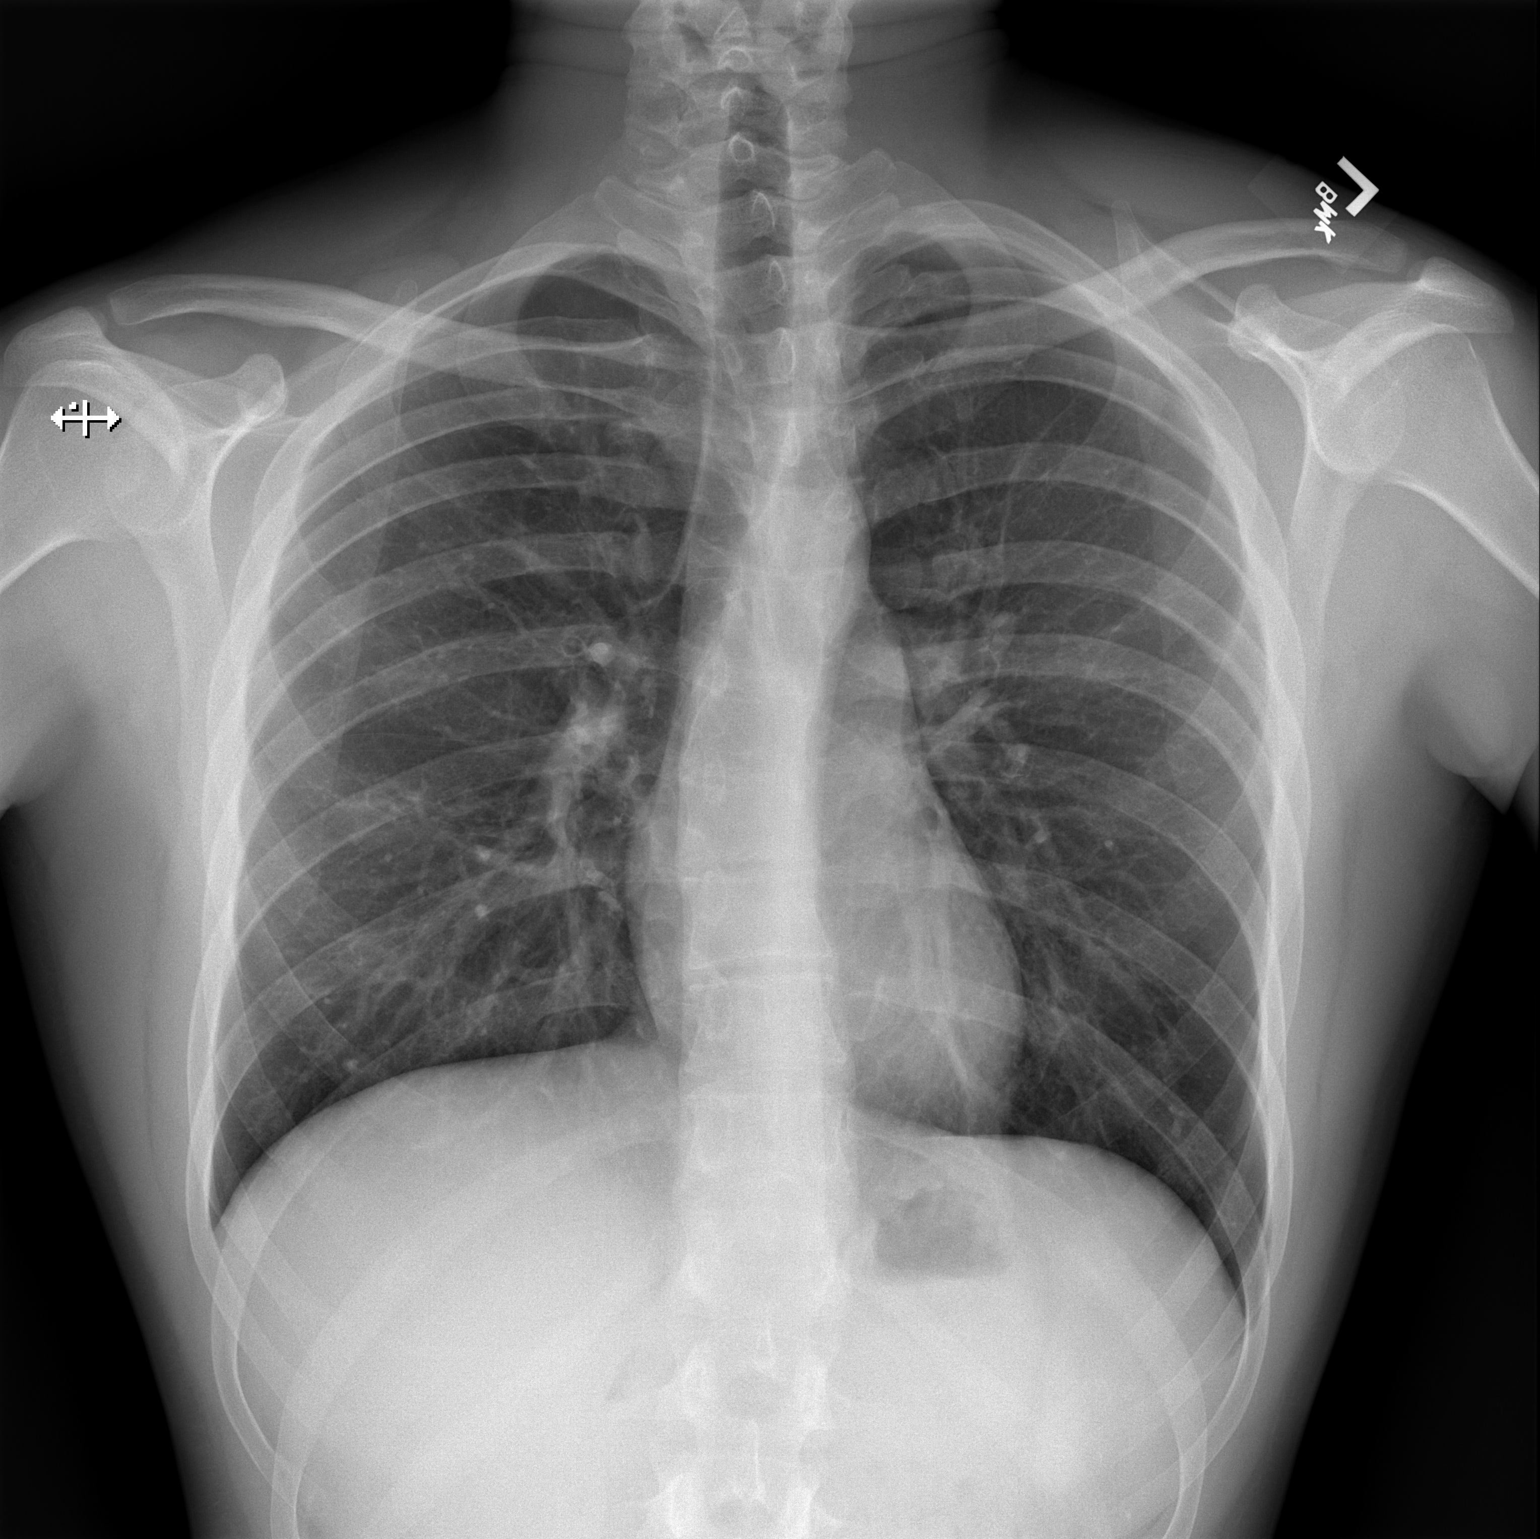
[im 2/2]
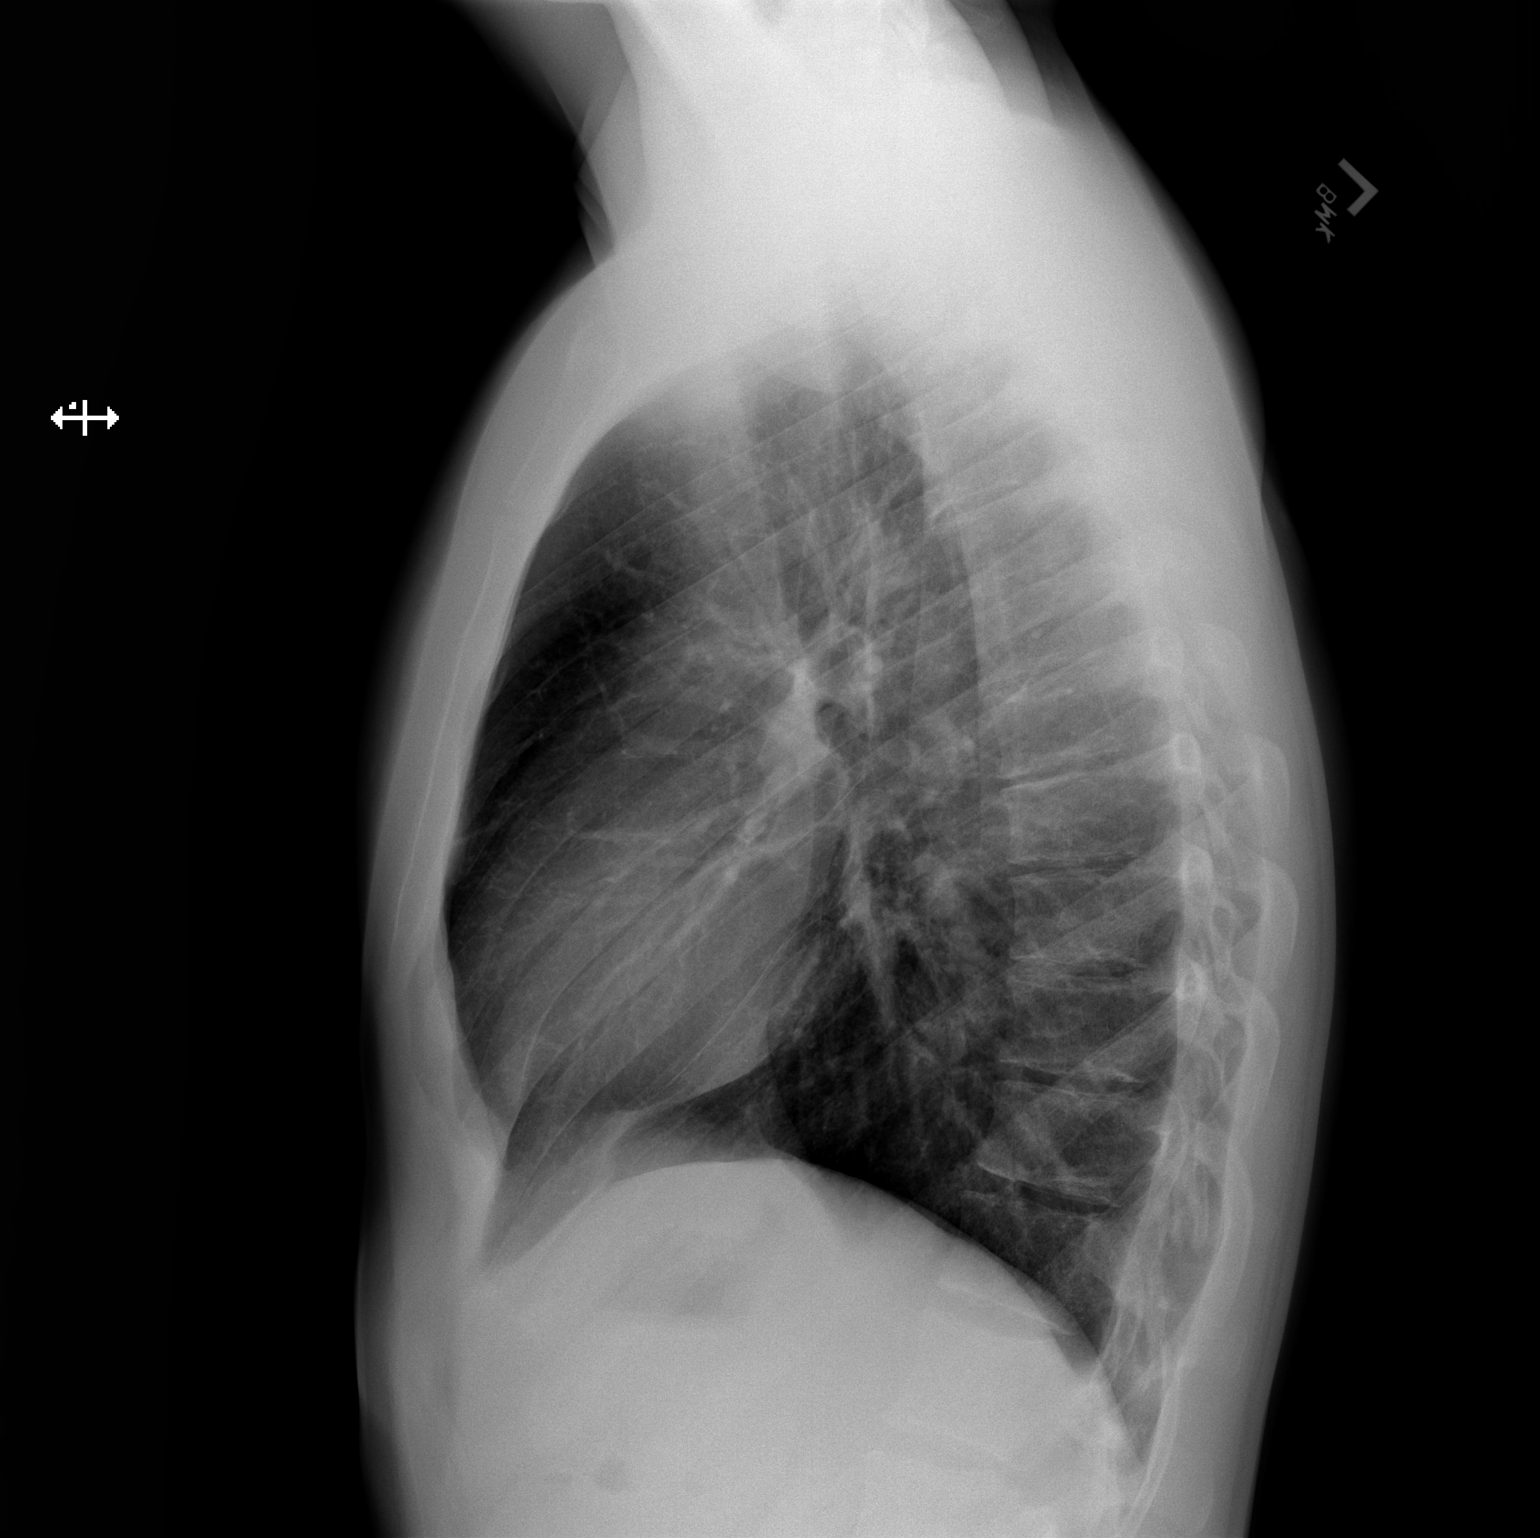

[2 of 2 positions shown; findings below may reference images not displayed]

FINDINGS: There is mild hyperinflation, unchanged. Cardiomediastinal contours
are normal. No focal airspace consolidation or pulmonary edema. No
pneumothorax or pleural effusion.
IMPRESSION: Mild hyperinflation.  Clear lungs.

## 2018-01-30 IMAGING — CR DG HAND COMPLETE 3+V*R*
3 series · 3 of 3 positions shown · non-contrast
Comparison: None.

CLINICAL DATA: Right hand pain and swelling.  No known injury.

EXAM:
RIGHT HAND - COMPLETE 3+ VIEW

[hand ap]
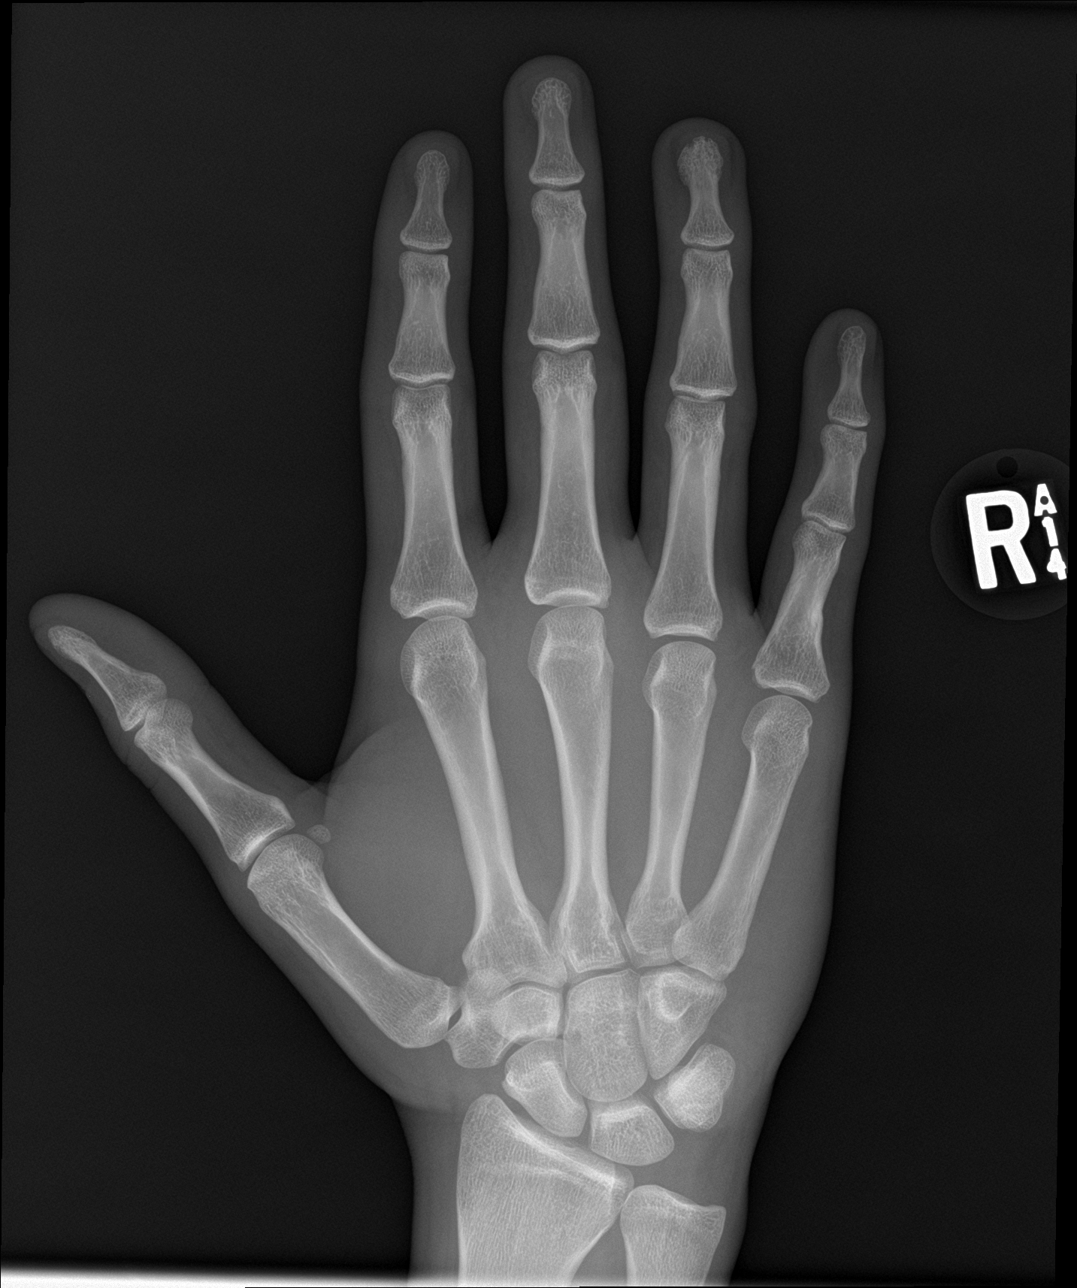

[hand obl]
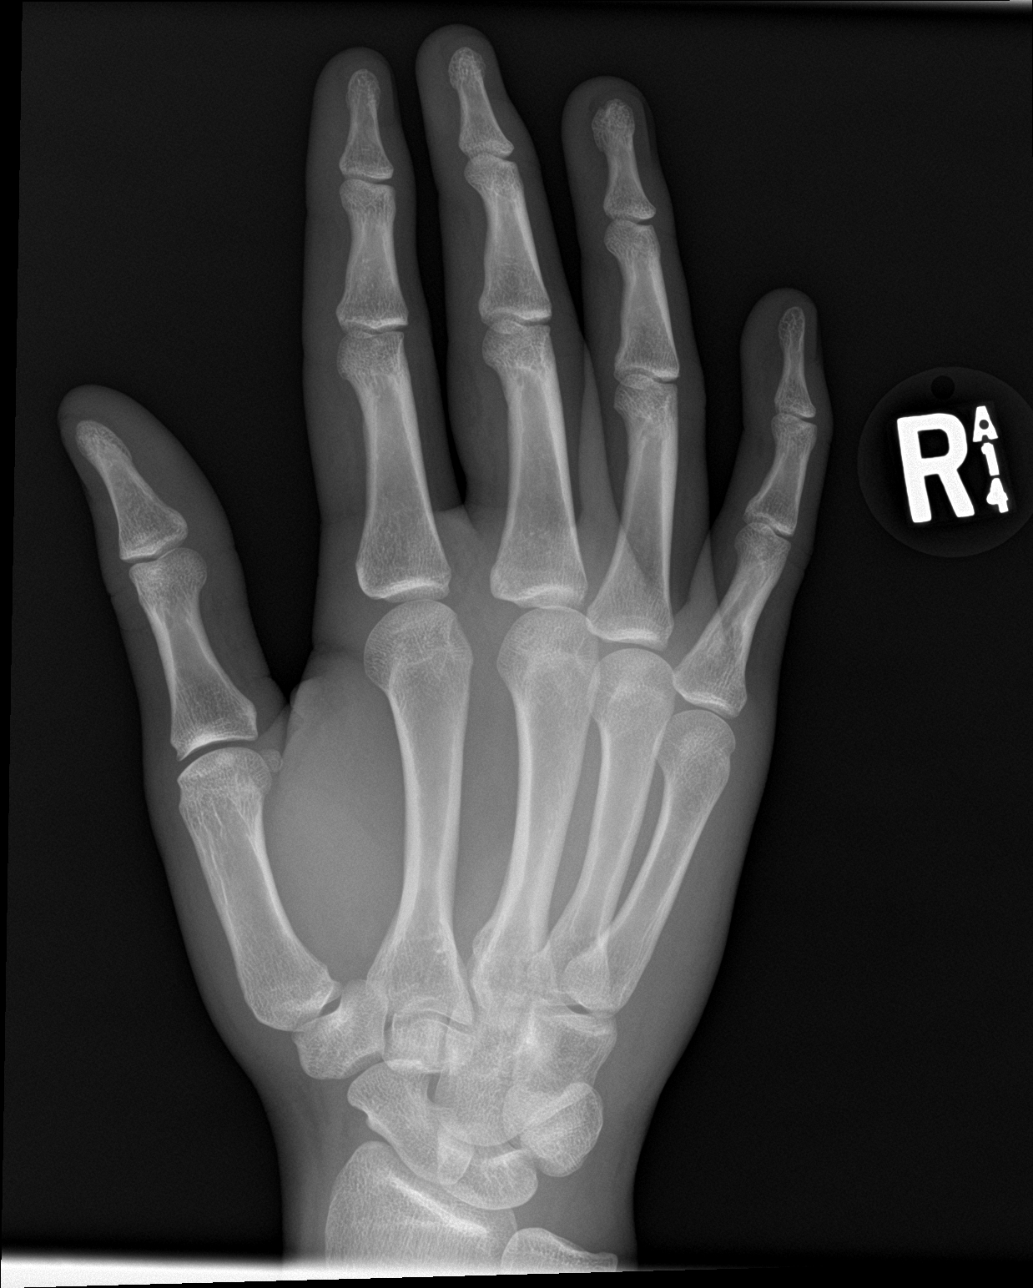

[hand lat]
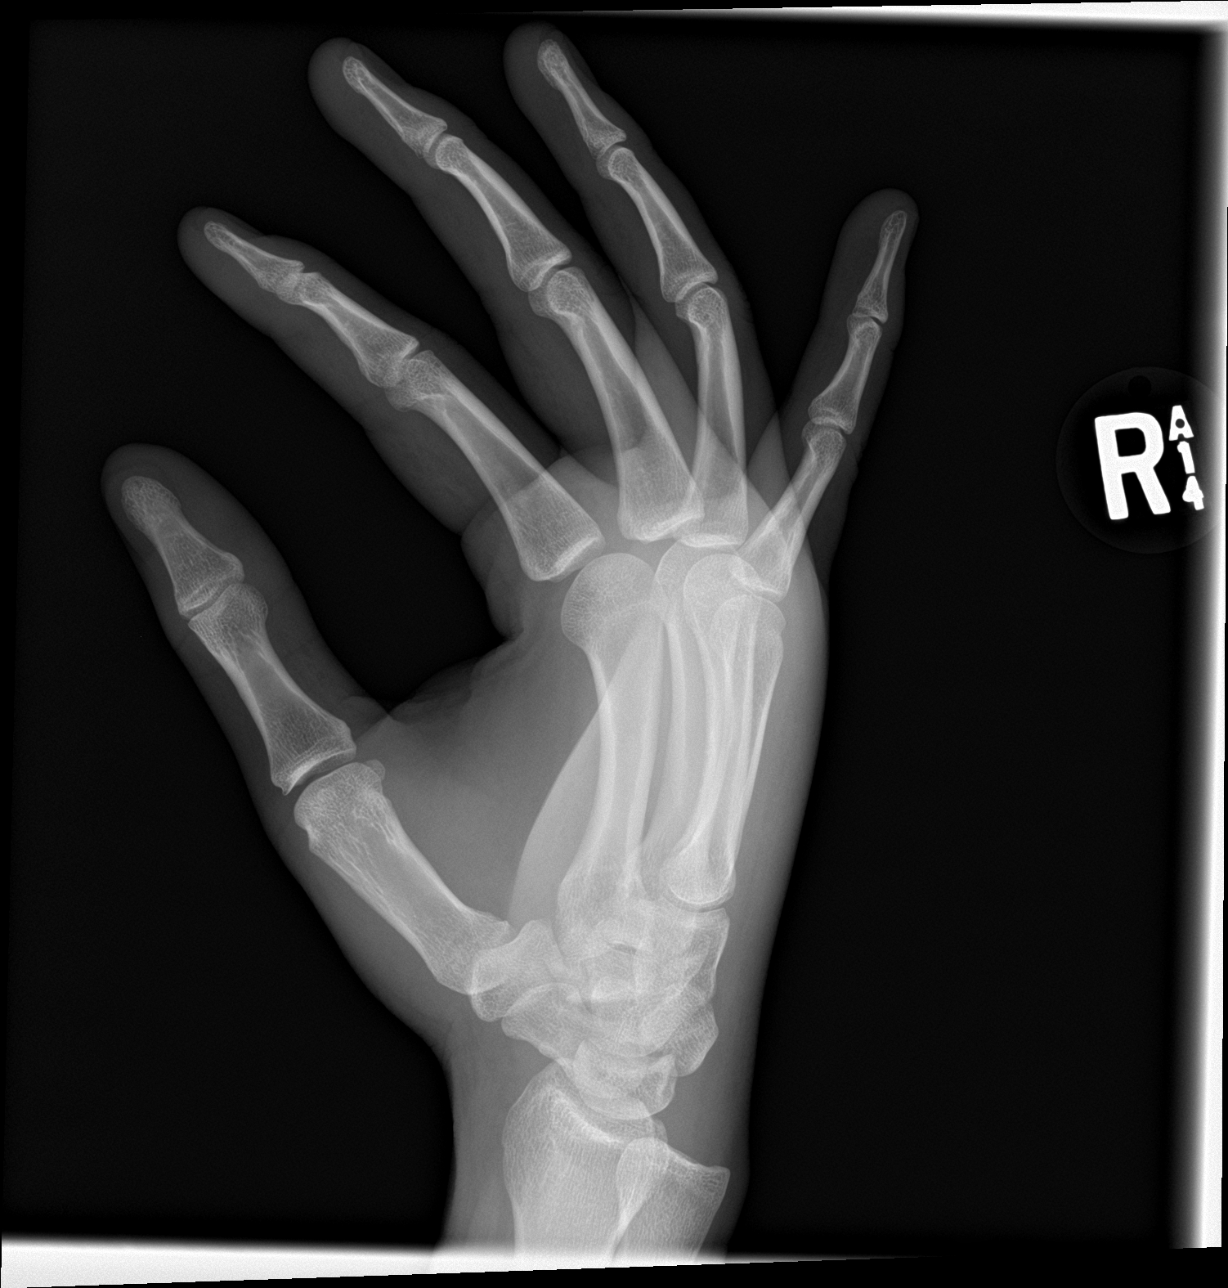

[3 of 3 positions shown; findings below may reference images not displayed]

FINDINGS: There is no evidence of acute fracture or dislocation. There is an
old healed fracture of the base of the proximal phalanx of the
little finger. There is no evidence of arthropathy or other focal
bone abnormality.
IMPRESSION: No acute bone abnormality.  Soft tissue swelling.

## 2020-07-26 DIAGNOSIS — S335XXA Sprain of ligaments of lumbar spine, initial encounter: Secondary | ICD-10-CM | POA: Diagnosis not present

## 2020-08-09 DIAGNOSIS — G43909 Migraine, unspecified, not intractable, without status migrainosus: Secondary | ICD-10-CM | POA: Diagnosis not present

## 2020-08-09 DIAGNOSIS — Z0000 Encounter for general adult medical examination without abnormal findings: Secondary | ICD-10-CM | POA: Diagnosis not present

## 2020-08-09 DIAGNOSIS — M549 Dorsalgia, unspecified: Secondary | ICD-10-CM | POA: Diagnosis not present

## 2020-08-09 DIAGNOSIS — G8929 Other chronic pain: Secondary | ICD-10-CM | POA: Diagnosis not present

## 2020-08-09 DIAGNOSIS — Z1331 Encounter for screening for depression: Secondary | ICD-10-CM | POA: Diagnosis not present

## 2020-09-08 ENCOUNTER — Other Ambulatory Visit: Payer: Self-pay

## 2020-09-08 ENCOUNTER — Ambulatory Visit: Payer: BC Managed Care – PPO | Admitting: Urology

## 2020-09-08 ENCOUNTER — Encounter: Payer: Self-pay | Admitting: Urology

## 2020-09-08 VITALS — BP 135/77 | HR 78 | Ht 69.0 in | Wt 192.0 lb

## 2020-09-08 DIAGNOSIS — S161XXA Strain of muscle, fascia and tendon at neck level, initial encounter: Secondary | ICD-10-CM | POA: Diagnosis not present

## 2020-09-08 DIAGNOSIS — Z3009 Encounter for other general counseling and advice on contraception: Secondary | ICD-10-CM

## 2020-09-08 DIAGNOSIS — M9901 Segmental and somatic dysfunction of cervical region: Secondary | ICD-10-CM | POA: Diagnosis not present

## 2020-09-08 DIAGNOSIS — M9903 Segmental and somatic dysfunction of lumbar region: Secondary | ICD-10-CM | POA: Diagnosis not present

## 2020-09-08 DIAGNOSIS — M9902 Segmental and somatic dysfunction of thoracic region: Secondary | ICD-10-CM | POA: Diagnosis not present

## 2020-09-08 NOTE — Patient Instructions (Signed)

## 2020-09-08 NOTE — Progress Notes (Signed)
09/08/2020 9:17 AM   Mark Keith. 1991/02/15 [id number]  Referring provider: Lonie Peak, PA-C [address] [place],  [place] [address]  Chief Complaint  Patient presents with  . VAS Consult    HPI: Mark Keith. is a 29y.o. male who presents today with his wife for vasectomy counseling.  . They have 2 children . Denies prior history urologic problems including chronic scrotal pain, epididymitis or orchitis . No previous history inguinal/pelvic hernia . No history of bleeding or clotting disorders   PMH: Past Medical History:  Diagnosis Date  . Asthma     Surgical History: No past surgical history on file.  Home Medications:  Allergies as of 09/08/2020   No Known Allergies     Medication List       Accurate as of Sep 08, 2020  9:17 AM. If you have any questions, ask your nurse or doctor.        STOP taking these medications   Cetirizine HCl 10 MG Caps Stopped by: Riki Altes, MD   naproxen 500 MG tablet Commonly known as: Naprosyn Stopped by: Riki Altes, MD   predniSONE 20 MG tablet Commonly known as: DELTASONE Stopped by: Riki Altes, MD     TAKE these medications   albuterol 108 (90 Base) MCG/ACT inhaler Commonly known as: VENTOLIN HFA Inhale 2 puffs into the lungs every 6 (six) hours as needed for wheezing or shortness of breath.   fluticasone 50 MCG/ACT nasal spray Commonly known as: Flonase Place 2 sprays into both nostrils daily.   lidocaine 2 % solution Commonly known as: XYLOCAINE Use as directed 10 mLs in the mouth or throat as needed for mouth pain.       Allergies: No Known Allergies  Family History: No family history on file.  Social History:  reports that he has quit smoking. His smoking use included cigarettes. He has never used smokeless tobacco. He reports that he does not drink alcohol. No history on file for drug use.   Physical Exam: BP 135/77   Pulse 78   Ht 5\' 9"  (1.753 m)   Wt 192  lb (87.1 kg)   BMI 28.35 kg/m   Constitutional:  Alert and oriented, No acute distress. HEENT: [place] AT, moist mucus membranes.  Trachea midline, no masses. Cardiovascular: No clubbing, cyanosis, or edema. Respiratory: Normal respiratory effort, no increased work of breathing. GI: Abdomen is soft, nontender, nondistended, no abdominal masses GU: Phallus without lesions, testes descended bilaterally without masses or tenderness, spermatic cord/epididymis palpably normal bilaterally.  Vasa palpable bilaterally Skin: No rashes, bruises or suspicious lesions. Neurologic: Grossly intact, no focal deficits, moving all 4 extremities. Psychiatric: Normal mood and affect.   Assessment & Plan:    1.  Undesired fertility . Desires to schedule vasectomy . We had a long discussion about vasectomy. We specifically discussed the procedure, recovery and the risks, benefits and alternatives of vasectomy. I explained that the procedure entails removal of a segment of each vas deferens, each of which conducts sperm, and that the purpose of this procedure is to cause sterility (inability to produce children or cause pregnancy). Vasectomy is intended to be permanent and irreversible form of contraception. Options for fertility after vasectomy include vasectomy reversal, or sperm retrieval with in vitro fertilization. These options are not always successful, and they may be expensive. We discussed reversible forms of birth control such as condoms, IUD or diaphragms, as well as the option of freezing sperm  in a sperm bank prior to the vasectomy procedure. We discussed the importance of avoiding strenuous exercise for four days after vasectomy, and the importance of refraining from any form of ejaculation for seven days after vasectomy. I explained that vasectomy does not produce immediate sterility so another form of contraceptive must be used until sterility is assured by having semen checked for sperm. Thus, a post  vasectomy semen analysis is necessary to confirm sterility. Rarely, vasectomy must be repeated. We discussed the approximately 1 in 2,000 risk of pregnancy after vasectomy for men who have post-vasectomy semen analysis showing absent sperm or rare non-motile sperm. Typical side effects include a small amount of oozing blood, some discomfort and mild swelling in the area of incision.  Vasectomy does not affect sexual performance, function, please, sensation, interest, desire, satisfaction, penile erection, volume of semen or ejaculation. Other rare risks include allergy or adverse reaction to an anesthetic, testicular atrophy, hematoma, infection/abscess, prolonged tenderness of the vas deferens, pain, swelling, painful nodule or scar (called sperm granuloma) or epididymtis. We discussed chronic testicular pain syndrome. This has been reported to occur in as many as 1-2% of men and may be permanent. This can be treated with medication, small procedures or (rarely) surgery. . Valium 10 mg as a preprocedure anxiolytic sent to pharmacy and he was informed he would need a driver if utilizing this medication    Riki Altes, MD  [facility] [address] [place], [place] [address] [phone]

## 2020-09-12 ENCOUNTER — Encounter: Payer: Self-pay | Admitting: Urology

## 2020-09-12 MED ORDER — DIAZEPAM 10 MG PO TABS: ORAL_TABLET | ORAL | 0 refills | Status: AC

## 2020-10-01 DIAGNOSIS — S161XXA Strain of muscle, fascia and tendon at neck level, initial encounter: Secondary | ICD-10-CM | POA: Diagnosis not present

## 2020-10-01 DIAGNOSIS — M9902 Segmental and somatic dysfunction of thoracic region: Secondary | ICD-10-CM | POA: Diagnosis not present

## 2020-10-01 DIAGNOSIS — M9901 Segmental and somatic dysfunction of cervical region: Secondary | ICD-10-CM | POA: Diagnosis not present

## 2020-10-01 DIAGNOSIS — M9903 Segmental and somatic dysfunction of lumbar region: Secondary | ICD-10-CM | POA: Diagnosis not present

## 2020-10-08 DIAGNOSIS — M9902 Segmental and somatic dysfunction of thoracic region: Secondary | ICD-10-CM | POA: Diagnosis not present

## 2020-10-08 DIAGNOSIS — S161XXA Strain of muscle, fascia and tendon at neck level, initial encounter: Secondary | ICD-10-CM | POA: Diagnosis not present

## 2020-10-08 DIAGNOSIS — M9903 Segmental and somatic dysfunction of lumbar region: Secondary | ICD-10-CM | POA: Diagnosis not present

## 2020-10-08 DIAGNOSIS — M9901 Segmental and somatic dysfunction of cervical region: Secondary | ICD-10-CM | POA: Diagnosis not present

## 2020-10-15 ENCOUNTER — Encounter: Payer: BC Managed Care – PPO | Admitting: Urology

## 2020-10-15 DIAGNOSIS — S161XXA Strain of muscle, fascia and tendon at neck level, initial encounter: Secondary | ICD-10-CM | POA: Diagnosis not present

## 2020-10-15 DIAGNOSIS — M9902 Segmental and somatic dysfunction of thoracic region: Secondary | ICD-10-CM | POA: Diagnosis not present

## 2020-10-15 DIAGNOSIS — M9903 Segmental and somatic dysfunction of lumbar region: Secondary | ICD-10-CM | POA: Diagnosis not present

## 2020-10-15 DIAGNOSIS — M9901 Segmental and somatic dysfunction of cervical region: Secondary | ICD-10-CM | POA: Diagnosis not present

## 2020-10-22 DIAGNOSIS — S161XXA Strain of muscle, fascia and tendon at neck level, initial encounter: Secondary | ICD-10-CM | POA: Diagnosis not present

## 2020-10-22 DIAGNOSIS — M9903 Segmental and somatic dysfunction of lumbar region: Secondary | ICD-10-CM | POA: Diagnosis not present

## 2020-10-22 DIAGNOSIS — M9902 Segmental and somatic dysfunction of thoracic region: Secondary | ICD-10-CM | POA: Diagnosis not present

## 2020-10-22 DIAGNOSIS — M9901 Segmental and somatic dysfunction of cervical region: Secondary | ICD-10-CM | POA: Diagnosis not present

## 2020-10-29 DIAGNOSIS — M9903 Segmental and somatic dysfunction of lumbar region: Secondary | ICD-10-CM | POA: Diagnosis not present

## 2020-10-29 DIAGNOSIS — M9901 Segmental and somatic dysfunction of cervical region: Secondary | ICD-10-CM | POA: Diagnosis not present

## 2020-10-29 DIAGNOSIS — S161XXA Strain of muscle, fascia and tendon at neck level, initial encounter: Secondary | ICD-10-CM | POA: Diagnosis not present

## 2020-10-29 DIAGNOSIS — M9902 Segmental and somatic dysfunction of thoracic region: Secondary | ICD-10-CM | POA: Diagnosis not present

## 2020-11-05 DIAGNOSIS — S161XXA Strain of muscle, fascia and tendon at neck level, initial encounter: Secondary | ICD-10-CM | POA: Diagnosis not present

## 2020-11-05 DIAGNOSIS — M9901 Segmental and somatic dysfunction of cervical region: Secondary | ICD-10-CM | POA: Diagnosis not present

## 2020-11-05 DIAGNOSIS — M9903 Segmental and somatic dysfunction of lumbar region: Secondary | ICD-10-CM | POA: Diagnosis not present

## 2020-11-05 DIAGNOSIS — M9902 Segmental and somatic dysfunction of thoracic region: Secondary | ICD-10-CM | POA: Diagnosis not present

## 2020-11-12 DIAGNOSIS — S161XXA Strain of muscle, fascia and tendon at neck level, initial encounter: Secondary | ICD-10-CM | POA: Diagnosis not present

## 2020-11-12 DIAGNOSIS — M9903 Segmental and somatic dysfunction of lumbar region: Secondary | ICD-10-CM | POA: Diagnosis not present

## 2020-11-12 DIAGNOSIS — M9901 Segmental and somatic dysfunction of cervical region: Secondary | ICD-10-CM | POA: Diagnosis not present

## 2020-11-12 DIAGNOSIS — M9902 Segmental and somatic dysfunction of thoracic region: Secondary | ICD-10-CM | POA: Diagnosis not present

## 2020-11-19 DIAGNOSIS — M9903 Segmental and somatic dysfunction of lumbar region: Secondary | ICD-10-CM | POA: Diagnosis not present

## 2020-11-19 DIAGNOSIS — M9902 Segmental and somatic dysfunction of thoracic region: Secondary | ICD-10-CM | POA: Diagnosis not present

## 2020-11-19 DIAGNOSIS — S161XXA Strain of muscle, fascia and tendon at neck level, initial encounter: Secondary | ICD-10-CM | POA: Diagnosis not present

## 2020-11-19 DIAGNOSIS — M9901 Segmental and somatic dysfunction of cervical region: Secondary | ICD-10-CM | POA: Diagnosis not present

## 2020-11-26 DIAGNOSIS — M9901 Segmental and somatic dysfunction of cervical region: Secondary | ICD-10-CM | POA: Diagnosis not present

## 2020-11-26 DIAGNOSIS — M9902 Segmental and somatic dysfunction of thoracic region: Secondary | ICD-10-CM | POA: Diagnosis not present

## 2020-11-26 DIAGNOSIS — M9903 Segmental and somatic dysfunction of lumbar region: Secondary | ICD-10-CM | POA: Diagnosis not present

## 2020-11-26 DIAGNOSIS — S161XXA Strain of muscle, fascia and tendon at neck level, initial encounter: Secondary | ICD-10-CM | POA: Diagnosis not present

## 2020-12-03 DIAGNOSIS — S161XXA Strain of muscle, fascia and tendon at neck level, initial encounter: Secondary | ICD-10-CM | POA: Diagnosis not present

## 2020-12-03 DIAGNOSIS — M9902 Segmental and somatic dysfunction of thoracic region: Secondary | ICD-10-CM | POA: Diagnosis not present

## 2020-12-03 DIAGNOSIS — M9903 Segmental and somatic dysfunction of lumbar region: Secondary | ICD-10-CM | POA: Diagnosis not present

## 2020-12-03 DIAGNOSIS — M9901 Segmental and somatic dysfunction of cervical region: Secondary | ICD-10-CM | POA: Diagnosis not present

## 2020-12-10 DIAGNOSIS — G8929 Other chronic pain: Secondary | ICD-10-CM | POA: Diagnosis not present

## 2020-12-10 DIAGNOSIS — M549 Dorsalgia, unspecified: Secondary | ICD-10-CM | POA: Diagnosis not present

## 2020-12-10 DIAGNOSIS — G43909 Migraine, unspecified, not intractable, without status migrainosus: Secondary | ICD-10-CM | POA: Diagnosis not present

## 2020-12-10 DIAGNOSIS — U071 COVID-19: Secondary | ICD-10-CM | POA: Diagnosis not present

## 2020-12-24 ENCOUNTER — Encounter: Payer: BC Managed Care – PPO | Admitting: Urology

## 2020-12-24 DIAGNOSIS — G43909 Migraine, unspecified, not intractable, without status migrainosus: Secondary | ICD-10-CM | POA: Diagnosis not present

## 2020-12-24 DIAGNOSIS — M549 Dorsalgia, unspecified: Secondary | ICD-10-CM | POA: Diagnosis not present

## 2020-12-24 DIAGNOSIS — G8929 Other chronic pain: Secondary | ICD-10-CM | POA: Diagnosis not present

## 2020-12-24 DIAGNOSIS — K219 Gastro-esophageal reflux disease without esophagitis: Secondary | ICD-10-CM | POA: Diagnosis not present

## 2021-01-07 DIAGNOSIS — M9901 Segmental and somatic dysfunction of cervical region: Secondary | ICD-10-CM | POA: Diagnosis not present

## 2021-01-07 DIAGNOSIS — M9903 Segmental and somatic dysfunction of lumbar region: Secondary | ICD-10-CM | POA: Diagnosis not present

## 2021-01-07 DIAGNOSIS — M9902 Segmental and somatic dysfunction of thoracic region: Secondary | ICD-10-CM | POA: Diagnosis not present

## 2021-01-07 DIAGNOSIS — S161XXA Strain of muscle, fascia and tendon at neck level, initial encounter: Secondary | ICD-10-CM | POA: Diagnosis not present

## 2021-01-14 DIAGNOSIS — S161XXA Strain of muscle, fascia and tendon at neck level, initial encounter: Secondary | ICD-10-CM | POA: Diagnosis not present

## 2021-01-14 DIAGNOSIS — M9903 Segmental and somatic dysfunction of lumbar region: Secondary | ICD-10-CM | POA: Diagnosis not present

## 2021-01-14 DIAGNOSIS — M9902 Segmental and somatic dysfunction of thoracic region: Secondary | ICD-10-CM | POA: Diagnosis not present

## 2021-01-14 DIAGNOSIS — M9901 Segmental and somatic dysfunction of cervical region: Secondary | ICD-10-CM | POA: Diagnosis not present

## 2021-01-28 DIAGNOSIS — M9901 Segmental and somatic dysfunction of cervical region: Secondary | ICD-10-CM | POA: Diagnosis not present

## 2021-01-28 DIAGNOSIS — M9902 Segmental and somatic dysfunction of thoracic region: Secondary | ICD-10-CM | POA: Diagnosis not present

## 2021-01-28 DIAGNOSIS — M9903 Segmental and somatic dysfunction of lumbar region: Secondary | ICD-10-CM | POA: Diagnosis not present

## 2021-01-28 DIAGNOSIS — S161XXA Strain of muscle, fascia and tendon at neck level, initial encounter: Secondary | ICD-10-CM | POA: Diagnosis not present

## 2021-02-04 DIAGNOSIS — M9902 Segmental and somatic dysfunction of thoracic region: Secondary | ICD-10-CM | POA: Diagnosis not present

## 2021-02-04 DIAGNOSIS — S161XXA Strain of muscle, fascia and tendon at neck level, initial encounter: Secondary | ICD-10-CM | POA: Diagnosis not present

## 2021-02-04 DIAGNOSIS — M9901 Segmental and somatic dysfunction of cervical region: Secondary | ICD-10-CM | POA: Diagnosis not present

## 2021-02-04 DIAGNOSIS — M9903 Segmental and somatic dysfunction of lumbar region: Secondary | ICD-10-CM | POA: Diagnosis not present

## 2021-02-18 DIAGNOSIS — S161XXA Strain of muscle, fascia and tendon at neck level, initial encounter: Secondary | ICD-10-CM | POA: Diagnosis not present

## 2021-02-18 DIAGNOSIS — M9902 Segmental and somatic dysfunction of thoracic region: Secondary | ICD-10-CM | POA: Diagnosis not present

## 2021-02-18 DIAGNOSIS — M9901 Segmental and somatic dysfunction of cervical region: Secondary | ICD-10-CM | POA: Diagnosis not present

## 2021-02-18 DIAGNOSIS — M9903 Segmental and somatic dysfunction of lumbar region: Secondary | ICD-10-CM | POA: Diagnosis not present

## 2021-02-25 DIAGNOSIS — M9902 Segmental and somatic dysfunction of thoracic region: Secondary | ICD-10-CM | POA: Diagnosis not present

## 2021-02-25 DIAGNOSIS — S161XXA Strain of muscle, fascia and tendon at neck level, initial encounter: Secondary | ICD-10-CM | POA: Diagnosis not present

## 2021-02-25 DIAGNOSIS — G43909 Migraine, unspecified, not intractable, without status migrainosus: Secondary | ICD-10-CM | POA: Diagnosis not present

## 2021-02-25 DIAGNOSIS — K219 Gastro-esophageal reflux disease without esophagitis: Secondary | ICD-10-CM | POA: Diagnosis not present

## 2021-02-25 DIAGNOSIS — M549 Dorsalgia, unspecified: Secondary | ICD-10-CM | POA: Diagnosis not present

## 2021-02-25 DIAGNOSIS — G8929 Other chronic pain: Secondary | ICD-10-CM | POA: Diagnosis not present

## 2021-02-25 DIAGNOSIS — M9903 Segmental and somatic dysfunction of lumbar region: Secondary | ICD-10-CM | POA: Diagnosis not present

## 2021-02-25 DIAGNOSIS — M9901 Segmental and somatic dysfunction of cervical region: Secondary | ICD-10-CM | POA: Diagnosis not present

## 2021-03-04 DIAGNOSIS — M9903 Segmental and somatic dysfunction of lumbar region: Secondary | ICD-10-CM | POA: Diagnosis not present

## 2021-03-04 DIAGNOSIS — M9902 Segmental and somatic dysfunction of thoracic region: Secondary | ICD-10-CM | POA: Diagnosis not present

## 2021-03-04 DIAGNOSIS — M9901 Segmental and somatic dysfunction of cervical region: Secondary | ICD-10-CM | POA: Diagnosis not present

## 2021-03-04 DIAGNOSIS — S161XXA Strain of muscle, fascia and tendon at neck level, initial encounter: Secondary | ICD-10-CM | POA: Diagnosis not present

## 2021-03-11 DIAGNOSIS — S161XXA Strain of muscle, fascia and tendon at neck level, initial encounter: Secondary | ICD-10-CM | POA: Diagnosis not present

## 2021-03-11 DIAGNOSIS — M9902 Segmental and somatic dysfunction of thoracic region: Secondary | ICD-10-CM | POA: Diagnosis not present

## 2021-03-11 DIAGNOSIS — M9901 Segmental and somatic dysfunction of cervical region: Secondary | ICD-10-CM | POA: Diagnosis not present

## 2021-03-11 DIAGNOSIS — M9903 Segmental and somatic dysfunction of lumbar region: Secondary | ICD-10-CM | POA: Diagnosis not present

## 2021-03-25 DIAGNOSIS — M9903 Segmental and somatic dysfunction of lumbar region: Secondary | ICD-10-CM | POA: Diagnosis not present

## 2021-03-25 DIAGNOSIS — S161XXA Strain of muscle, fascia and tendon at neck level, initial encounter: Secondary | ICD-10-CM | POA: Diagnosis not present

## 2021-03-25 DIAGNOSIS — M9902 Segmental and somatic dysfunction of thoracic region: Secondary | ICD-10-CM | POA: Diagnosis not present

## 2021-03-25 DIAGNOSIS — M9901 Segmental and somatic dysfunction of cervical region: Secondary | ICD-10-CM | POA: Diagnosis not present

## 2021-05-06 DIAGNOSIS — M9903 Segmental and somatic dysfunction of lumbar region: Secondary | ICD-10-CM | POA: Diagnosis not present

## 2021-05-06 DIAGNOSIS — S161XXA Strain of muscle, fascia and tendon at neck level, initial encounter: Secondary | ICD-10-CM | POA: Diagnosis not present

## 2021-05-06 DIAGNOSIS — M9901 Segmental and somatic dysfunction of cervical region: Secondary | ICD-10-CM | POA: Diagnosis not present

## 2021-05-06 DIAGNOSIS — M9902 Segmental and somatic dysfunction of thoracic region: Secondary | ICD-10-CM | POA: Diagnosis not present

## 2021-05-13 DIAGNOSIS — M9902 Segmental and somatic dysfunction of thoracic region: Secondary | ICD-10-CM | POA: Diagnosis not present

## 2021-05-13 DIAGNOSIS — S161XXA Strain of muscle, fascia and tendon at neck level, initial encounter: Secondary | ICD-10-CM | POA: Diagnosis not present

## 2021-05-13 DIAGNOSIS — M9901 Segmental and somatic dysfunction of cervical region: Secondary | ICD-10-CM | POA: Diagnosis not present

## 2021-05-13 DIAGNOSIS — M9903 Segmental and somatic dysfunction of lumbar region: Secondary | ICD-10-CM | POA: Diagnosis not present

## 2021-05-20 DIAGNOSIS — M9903 Segmental and somatic dysfunction of lumbar region: Secondary | ICD-10-CM | POA: Diagnosis not present

## 2021-05-20 DIAGNOSIS — M9902 Segmental and somatic dysfunction of thoracic region: Secondary | ICD-10-CM | POA: Diagnosis not present

## 2021-05-20 DIAGNOSIS — S161XXA Strain of muscle, fascia and tendon at neck level, initial encounter: Secondary | ICD-10-CM | POA: Diagnosis not present

## 2021-05-20 DIAGNOSIS — M9901 Segmental and somatic dysfunction of cervical region: Secondary | ICD-10-CM | POA: Diagnosis not present

## 2021-05-27 DIAGNOSIS — M9901 Segmental and somatic dysfunction of cervical region: Secondary | ICD-10-CM | POA: Diagnosis not present

## 2021-05-27 DIAGNOSIS — M9902 Segmental and somatic dysfunction of thoracic region: Secondary | ICD-10-CM | POA: Diagnosis not present

## 2021-05-27 DIAGNOSIS — S161XXA Strain of muscle, fascia and tendon at neck level, initial encounter: Secondary | ICD-10-CM | POA: Diagnosis not present

## 2021-05-27 DIAGNOSIS — M9903 Segmental and somatic dysfunction of lumbar region: Secondary | ICD-10-CM | POA: Diagnosis not present

## 2021-06-03 DIAGNOSIS — M9902 Segmental and somatic dysfunction of thoracic region: Secondary | ICD-10-CM | POA: Diagnosis not present

## 2021-06-03 DIAGNOSIS — M9903 Segmental and somatic dysfunction of lumbar region: Secondary | ICD-10-CM | POA: Diagnosis not present

## 2021-06-03 DIAGNOSIS — M9901 Segmental and somatic dysfunction of cervical region: Secondary | ICD-10-CM | POA: Diagnosis not present

## 2021-06-03 DIAGNOSIS — S161XXA Strain of muscle, fascia and tendon at neck level, initial encounter: Secondary | ICD-10-CM | POA: Diagnosis not present

## 2021-06-10 DIAGNOSIS — M9903 Segmental and somatic dysfunction of lumbar region: Secondary | ICD-10-CM | POA: Diagnosis not present

## 2021-06-10 DIAGNOSIS — S161XXA Strain of muscle, fascia and tendon at neck level, initial encounter: Secondary | ICD-10-CM | POA: Diagnosis not present

## 2021-06-10 DIAGNOSIS — M9901 Segmental and somatic dysfunction of cervical region: Secondary | ICD-10-CM | POA: Diagnosis not present

## 2021-06-10 DIAGNOSIS — M9902 Segmental and somatic dysfunction of thoracic region: Secondary | ICD-10-CM | POA: Diagnosis not present

## 2021-06-17 DIAGNOSIS — S161XXA Strain of muscle, fascia and tendon at neck level, initial encounter: Secondary | ICD-10-CM | POA: Diagnosis not present

## 2021-06-17 DIAGNOSIS — M9901 Segmental and somatic dysfunction of cervical region: Secondary | ICD-10-CM | POA: Diagnosis not present

## 2021-06-17 DIAGNOSIS — M9902 Segmental and somatic dysfunction of thoracic region: Secondary | ICD-10-CM | POA: Diagnosis not present

## 2021-06-17 DIAGNOSIS — M9903 Segmental and somatic dysfunction of lumbar region: Secondary | ICD-10-CM | POA: Diagnosis not present

## 2021-06-24 DIAGNOSIS — M9903 Segmental and somatic dysfunction of lumbar region: Secondary | ICD-10-CM | POA: Diagnosis not present

## 2021-06-24 DIAGNOSIS — S161XXA Strain of muscle, fascia and tendon at neck level, initial encounter: Secondary | ICD-10-CM | POA: Diagnosis not present

## 2021-06-24 DIAGNOSIS — M9901 Segmental and somatic dysfunction of cervical region: Secondary | ICD-10-CM | POA: Diagnosis not present

## 2021-06-24 DIAGNOSIS — M9902 Segmental and somatic dysfunction of thoracic region: Secondary | ICD-10-CM | POA: Diagnosis not present

## 2021-07-01 DIAGNOSIS — M9902 Segmental and somatic dysfunction of thoracic region: Secondary | ICD-10-CM | POA: Diagnosis not present

## 2021-07-01 DIAGNOSIS — M9901 Segmental and somatic dysfunction of cervical region: Secondary | ICD-10-CM | POA: Diagnosis not present

## 2021-07-01 DIAGNOSIS — M9903 Segmental and somatic dysfunction of lumbar region: Secondary | ICD-10-CM | POA: Diagnosis not present

## 2021-07-01 DIAGNOSIS — S161XXA Strain of muscle, fascia and tendon at neck level, initial encounter: Secondary | ICD-10-CM | POA: Diagnosis not present

## 2021-07-15 DIAGNOSIS — S161XXA Strain of muscle, fascia and tendon at neck level, initial encounter: Secondary | ICD-10-CM | POA: Diagnosis not present

## 2021-07-15 DIAGNOSIS — M9903 Segmental and somatic dysfunction of lumbar region: Secondary | ICD-10-CM | POA: Diagnosis not present

## 2021-07-15 DIAGNOSIS — M9902 Segmental and somatic dysfunction of thoracic region: Secondary | ICD-10-CM | POA: Diagnosis not present

## 2021-07-15 DIAGNOSIS — M9901 Segmental and somatic dysfunction of cervical region: Secondary | ICD-10-CM | POA: Diagnosis not present

## 2021-07-22 DIAGNOSIS — M9903 Segmental and somatic dysfunction of lumbar region: Secondary | ICD-10-CM | POA: Diagnosis not present

## 2021-07-22 DIAGNOSIS — S161XXA Strain of muscle, fascia and tendon at neck level, initial encounter: Secondary | ICD-10-CM | POA: Diagnosis not present

## 2021-07-22 DIAGNOSIS — M9902 Segmental and somatic dysfunction of thoracic region: Secondary | ICD-10-CM | POA: Diagnosis not present

## 2021-07-22 DIAGNOSIS — M9901 Segmental and somatic dysfunction of cervical region: Secondary | ICD-10-CM | POA: Diagnosis not present

## 2021-07-29 DIAGNOSIS — M9903 Segmental and somatic dysfunction of lumbar region: Secondary | ICD-10-CM | POA: Diagnosis not present

## 2021-07-29 DIAGNOSIS — M9901 Segmental and somatic dysfunction of cervical region: Secondary | ICD-10-CM | POA: Diagnosis not present

## 2021-07-29 DIAGNOSIS — M9902 Segmental and somatic dysfunction of thoracic region: Secondary | ICD-10-CM | POA: Diagnosis not present

## 2021-07-29 DIAGNOSIS — S161XXA Strain of muscle, fascia and tendon at neck level, initial encounter: Secondary | ICD-10-CM | POA: Diagnosis not present

## 2021-08-05 DIAGNOSIS — M9901 Segmental and somatic dysfunction of cervical region: Secondary | ICD-10-CM | POA: Diagnosis not present

## 2021-08-05 DIAGNOSIS — M9902 Segmental and somatic dysfunction of thoracic region: Secondary | ICD-10-CM | POA: Diagnosis not present

## 2021-08-05 DIAGNOSIS — S161XXA Strain of muscle, fascia and tendon at neck level, initial encounter: Secondary | ICD-10-CM | POA: Diagnosis not present

## 2021-08-05 DIAGNOSIS — M9903 Segmental and somatic dysfunction of lumbar region: Secondary | ICD-10-CM | POA: Diagnosis not present

## 2021-08-19 DIAGNOSIS — M5414 Radiculopathy, thoracic region: Secondary | ICD-10-CM | POA: Diagnosis not present

## 2021-08-26 DIAGNOSIS — Z1331 Encounter for screening for depression: Secondary | ICD-10-CM | POA: Diagnosis not present

## 2021-08-26 DIAGNOSIS — K219 Gastro-esophageal reflux disease without esophagitis: Secondary | ICD-10-CM | POA: Diagnosis not present

## 2021-08-26 DIAGNOSIS — M549 Dorsalgia, unspecified: Secondary | ICD-10-CM | POA: Diagnosis not present

## 2021-08-26 DIAGNOSIS — G8929 Other chronic pain: Secondary | ICD-10-CM | POA: Diagnosis not present

## 2021-08-26 DIAGNOSIS — G43909 Migraine, unspecified, not intractable, without status migrainosus: Secondary | ICD-10-CM | POA: Diagnosis not present

## 2021-11-03 DIAGNOSIS — Y990 Civilian activity done for income or pay: Secondary | ICD-10-CM | POA: Diagnosis not present

## 2021-11-03 DIAGNOSIS — S61213A Laceration without foreign body of left middle finger without damage to nail, initial encounter: Secondary | ICD-10-CM | POA: Diagnosis not present

## 2021-11-03 DIAGNOSIS — W230XXA Caught, crushed, jammed, or pinched between moving objects, initial encounter: Secondary | ICD-10-CM | POA: Diagnosis not present

## 2021-11-03 DIAGNOSIS — S62603A Fracture of unspecified phalanx of left middle finger, initial encounter for closed fracture: Secondary | ICD-10-CM | POA: Diagnosis not present

## 2021-11-03 DIAGNOSIS — S61313A Laceration without foreign body of left middle finger with damage to nail, initial encounter: Secondary | ICD-10-CM | POA: Diagnosis not present

## 2021-11-03 DIAGNOSIS — Z23 Encounter for immunization: Secondary | ICD-10-CM | POA: Diagnosis not present

## 2021-11-03 DIAGNOSIS — F17220 Nicotine dependence, chewing tobacco, uncomplicated: Secondary | ICD-10-CM | POA: Diagnosis not present

## 2021-11-03 DIAGNOSIS — S62633A Displaced fracture of distal phalanx of left middle finger, initial encounter for closed fracture: Secondary | ICD-10-CM | POA: Diagnosis not present

## 2021-11-04 DIAGNOSIS — M79645 Pain in left finger(s): Secondary | ICD-10-CM | POA: Diagnosis not present

## 2021-11-04 DIAGNOSIS — S62663D Nondisplaced fracture of distal phalanx of left middle finger, subsequent encounter for fracture with routine healing: Secondary | ICD-10-CM | POA: Diagnosis not present

## 2021-11-04 DIAGNOSIS — X58XXXA Exposure to other specified factors, initial encounter: Secondary | ICD-10-CM | POA: Diagnosis not present

## 2021-11-04 DIAGNOSIS — S62663B Nondisplaced fracture of distal phalanx of left middle finger, initial encounter for open fracture: Secondary | ICD-10-CM | POA: Diagnosis not present

## 2021-11-04 DIAGNOSIS — R110 Nausea: Secondary | ICD-10-CM | POA: Diagnosis not present

## 2021-11-17 DIAGNOSIS — S62633A Displaced fracture of distal phalanx of left middle finger, initial encounter for closed fracture: Secondary | ICD-10-CM | POA: Diagnosis not present

## 2021-11-17 DIAGNOSIS — Z6828 Body mass index (BMI) 28.0-28.9, adult: Secondary | ICD-10-CM | POA: Diagnosis not present

## 2021-11-17 DIAGNOSIS — Z5189 Encounter for other specified aftercare: Secondary | ICD-10-CM | POA: Diagnosis not present

## 2021-11-17 DIAGNOSIS — M79641 Pain in right hand: Secondary | ICD-10-CM | POA: Diagnosis not present

## 2021-11-17 DIAGNOSIS — M79646 Pain in unspecified finger(s): Secondary | ICD-10-CM | POA: Diagnosis not present

## 2021-11-17 DIAGNOSIS — M79642 Pain in left hand: Secondary | ICD-10-CM | POA: Diagnosis not present

## 2021-11-17 DIAGNOSIS — Z4801 Encounter for change or removal of surgical wound dressing: Secondary | ICD-10-CM | POA: Diagnosis not present

## 2021-11-17 DIAGNOSIS — M79645 Pain in left finger(s): Secondary | ICD-10-CM | POA: Diagnosis not present

## 2022-01-28 DIAGNOSIS — W270XXA Contact with workbench tool, initial encounter: Secondary | ICD-10-CM | POA: Diagnosis not present

## 2022-01-28 DIAGNOSIS — S61310A Laceration without foreign body of right index finger with damage to nail, initial encounter: Secondary | ICD-10-CM | POA: Diagnosis not present

## 2022-01-28 DIAGNOSIS — S62660B Nondisplaced fracture of distal phalanx of right index finger, initial encounter for open fracture: Secondary | ICD-10-CM | POA: Diagnosis not present

## 2022-01-28 DIAGNOSIS — F17220 Nicotine dependence, chewing tobacco, uncomplicated: Secondary | ICD-10-CM | POA: Diagnosis not present

## 2022-01-28 DIAGNOSIS — Z6828 Body mass index (BMI) 28.0-28.9, adult: Secondary | ICD-10-CM | POA: Diagnosis not present

## 2022-01-28 DIAGNOSIS — S62630A Displaced fracture of distal phalanx of right index finger, initial encounter for closed fracture: Secondary | ICD-10-CM | POA: Diagnosis not present

## 2022-01-31 DIAGNOSIS — S67190A Crushing injury of right index finger, initial encounter: Secondary | ICD-10-CM | POA: Diagnosis not present

## 2022-02-07 DIAGNOSIS — S67190A Crushing injury of right index finger, initial encounter: Secondary | ICD-10-CM | POA: Diagnosis not present

## 2022-02-15 DIAGNOSIS — S61210D Laceration without foreign body of right index finger without damage to nail, subsequent encounter: Secondary | ICD-10-CM | POA: Diagnosis not present

## 2022-03-14 DIAGNOSIS — G8929 Other chronic pain: Secondary | ICD-10-CM | POA: Diagnosis not present

## 2022-03-14 DIAGNOSIS — K219 Gastro-esophageal reflux disease without esophagitis: Secondary | ICD-10-CM | POA: Diagnosis not present

## 2022-03-14 DIAGNOSIS — M549 Dorsalgia, unspecified: Secondary | ICD-10-CM | POA: Diagnosis not present

## 2022-03-14 DIAGNOSIS — G43909 Migraine, unspecified, not intractable, without status migrainosus: Secondary | ICD-10-CM | POA: Diagnosis not present

## 2022-03-15 DIAGNOSIS — S67190A Crushing injury of right index finger, initial encounter: Secondary | ICD-10-CM | POA: Diagnosis not present

## 2022-04-18 DIAGNOSIS — S67193A Crushing injury of left middle finger, initial encounter: Secondary | ICD-10-CM | POA: Diagnosis not present

## 2022-11-02 DIAGNOSIS — M79644 Pain in right finger(s): Secondary | ICD-10-CM | POA: Diagnosis not present

## 2022-11-02 DIAGNOSIS — R600 Localized edema: Secondary | ICD-10-CM | POA: Diagnosis not present

## 2022-11-03 DIAGNOSIS — G43909 Migraine, unspecified, not intractable, without status migrainosus: Secondary | ICD-10-CM | POA: Diagnosis not present

## 2022-11-03 DIAGNOSIS — L03011 Cellulitis of right finger: Secondary | ICD-10-CM | POA: Diagnosis not present

## 2022-11-03 DIAGNOSIS — K219 Gastro-esophageal reflux disease without esophagitis: Secondary | ICD-10-CM | POA: Diagnosis not present

## 2022-11-03 DIAGNOSIS — M7712 Lateral epicondylitis, left elbow: Secondary | ICD-10-CM | POA: Diagnosis not present

## 2022-11-03 DIAGNOSIS — Z1331 Encounter for screening for depression: Secondary | ICD-10-CM | POA: Diagnosis not present

## 2023-05-11 DIAGNOSIS — J45901 Unspecified asthma with (acute) exacerbation: Secondary | ICD-10-CM | POA: Diagnosis not present

## 2023-05-11 DIAGNOSIS — M549 Dorsalgia, unspecified: Secondary | ICD-10-CM | POA: Diagnosis not present

## 2023-05-11 DIAGNOSIS — G43909 Migraine, unspecified, not intractable, without status migrainosus: Secondary | ICD-10-CM | POA: Diagnosis not present

## 2023-05-11 DIAGNOSIS — K219 Gastro-esophageal reflux disease without esophagitis: Secondary | ICD-10-CM | POA: Diagnosis not present

## 2023-11-16 DIAGNOSIS — M549 Dorsalgia, unspecified: Secondary | ICD-10-CM | POA: Diagnosis not present

## 2023-11-16 DIAGNOSIS — G43909 Migraine, unspecified, not intractable, without status migrainosus: Secondary | ICD-10-CM | POA: Diagnosis not present

## 2023-11-16 DIAGNOSIS — G8929 Other chronic pain: Secondary | ICD-10-CM | POA: Diagnosis not present

## 2023-11-16 DIAGNOSIS — K219 Gastro-esophageal reflux disease without esophagitis: Secondary | ICD-10-CM | POA: Diagnosis not present

## 2023-11-16 DIAGNOSIS — Z1331 Encounter for screening for depression: Secondary | ICD-10-CM | POA: Diagnosis not present

## 2024-01-14 ENCOUNTER — Other Ambulatory Visit: Payer: Self-pay

## 2024-01-14 ENCOUNTER — Emergency Department
Admission: EM | Admit: 2024-01-14 | Discharge: 2024-01-14 | Disposition: A | Discharge status: Home / Self Care | Payer: Self-pay | Attending: Emergency Medicine | Admitting: Emergency Medicine

## 2024-01-14 DIAGNOSIS — S0502XA Injury of conjunctiva and corneal abrasion without foreign body, left eye, initial encounter: Secondary | ICD-10-CM | POA: Insufficient documentation

## 2024-01-14 DIAGNOSIS — W228XXA Striking against or struck by other objects, initial encounter: Secondary | ICD-10-CM | POA: Insufficient documentation

## 2024-01-14 MED ORDER — TETRACAINE HCL 0.5 % OP SOLN
2.0000 [drp] | Freq: Once | OPHTHALMIC | Status: AC
  Administered 2024-01-14: 2 [drp] via OPHTHALMIC
  Filled 2024-01-14: qty 4

## 2024-01-14 MED ORDER — FLUORESCEIN SODIUM 1 MG OP STRP
1.0000 | ORAL_STRIP | Freq: Once | OPHTHALMIC | Status: AC
  Administered 2024-01-14: 1 via OPHTHALMIC
  Filled 2024-01-14: qty 1

## 2024-01-14 MED ORDER — ERYTHROMYCIN 5 MG/GM OP OINT: 1.0000 | TOPICAL_OINTMENT | Freq: Every day | OPHTHALMIC | 0 refills | Status: AC

## 2024-01-14 NOTE — ED Triage Notes (Addendum)
 Pt to ED via POV from home. Pt reports was hit in the eye by a tree branch last pm but pain and swelling has gotten worse. Pt denies vision changes but states now cannot open left eye

## 2024-01-14 NOTE — ED Provider Notes (Signed)
 [facility] Provider Note    Event Date/Time   First MD Initiated Contact with Patient 01/14/24 [phone]     (approximate)   History   Eye Problem   HPI  Mark Keith. is a 33 y.o. male with no reported past medical history who presents today for evaluation of left eye pain.  Patient reports that he was on his tractor yesterday backing up in a branch hit him in the left eye and he was not able to close his eye fast enough.  He reports that he has had watering from his eye, and pain with opening his eye ever since.  When his eye is able to open, he denies having any visual changes.  No periorbital swelling.  There are no active problems to display for this patient.         Physical Exam   Triage Vital Signs: ED Triage Vitals  Encounter Vitals Group     BP 01/14/24 0941 133/89     Girls Systolic BP Percentile --      Girls Diastolic BP Percentile --      Boys Systolic BP Percentile --      Boys Diastolic BP Percentile --      Pulse Rate 01/14/24 0941 (!) 57     Resp 01/14/24 0941 18     Temp 01/14/24 0941 98 F (36.7 C)     Temp Source 01/14/24 0941 Oral     SpO2 01/14/24 0941 98 %     Weight --      Height --      Head Circumference --      Peak Flow --      Pain Score 01/14/24 0939 7     Pain Loc --      Pain Education --      Exclude from Growth Chart --     Most recent vital signs: Vitals:   01/14/24 0941  BP: 133/89  Pulse: (!) 57  Resp: 18  Temp: 98 F (36.7 C)  SpO2: 98%    Physical Exam Vitals and nursing note reviewed.  Constitutional:      General: Awake and alert. No acute distress.    Appearance: Normal appearance. The patient is normal weight.  HENT:     Head: Normocephalic and atraumatic.     Mouth: Mucous membranes are moist.  Eyes:     General: PERRL. Normal EOMs        Right eye: No discharge.        Left eye: No discharge.  Holding eye closed, no periorbital swelling or ecchymosis.  No eyelid  swelling.  When eye open, no hyphema, no hypopyon.  No scleral injection.  There is a 3 mm linear area of fluorescein  uptake in the center of his cornea.  Negative Seidel sign.  No retained foreign body noted.  No chemosis.  No scleral injection.    Conjunctiva/sclera: Conjunctivae normal.  Cardiovascular:     Rate and Rhythm: Normal rate and regular rhythm.     Pulses: Normal pulses.  Pulmonary:     Effort: Pulmonary effort is normal. No respiratory distress.     Breath sounds: Normal breath sounds.  Abdominal:     Abdomen is soft. There is no abdominal tenderness. No rebound or guarding. No distention. Musculoskeletal:        General: No swelling. Normal range of motion.     Cervical back: Normal range of motion and neck supple.  Skin:    General: Skin is warm and dry.     Capillary Refill: Capillary refill takes less than 2 seconds.     Findings: No rash.  Neurological:     Mental Status: The patient is awake and alert.      ED Results / Procedures / Treatments   Labs (all labs ordered are listed, but only abnormal results are displayed) Labs Reviewed - No data to display   EKG     RADIOLOGY     PROCEDURES:  Critical Care performed:   Procedures   MEDICATIONS ORDERED IN ED: Medications  tetracaine  (PONTOCAINE) 0.5 % ophthalmic solution 2 drop (2 drops Left Eye Given 01/14/24 1022)  fluorescein  ophthalmic strip 1 strip (1 strip Left Eye Given 01/14/24 1022)     IMPRESSION / MDM / ASSESSMENT AND PLAN / ED COURSE  I reviewed the triage vital signs and the nursing notes.   Differential diagnosis includes, but is not limited to, corneal abrasion, hyphema, less likely open globe or orbital fracture.  Patient is awake and alert, no appears to be uncomfortable.  He is holding his eye closed.  There is no periorbital erythema or edema.  No ecchymosis.  There is no proptosis.  After tetracaine  was applied, patient able to open his eye and there is no hyphema or  hypopyon noted.  He has full and normal extraocular movements without pain.  Fluorescein  staining reveals a linear corneal abrasion to the center of the eye.  Negative Seidel sign, not consistent with open globe.  Pupil is reactive.  No scleral injection.  No chemosis.  He is able to see normally now that his eye is numb.  He does not wear contact lenses.  He was started on antibiotics and instructed to follow-up with ophthalmology.  Patient understands and agrees this plan.  Discharged in stable condition.   Patient's presentation is most consistent with acute complicated illness / injury requiring diagnostic workup.    FINAL CLINICAL IMPRESSION(S) / ED DIAGNOSES   Final diagnoses:  Abrasion of left cornea, initial encounter     Rx / DC Orders   ED Discharge Orders          Ordered    erythromycin  ophthalmic ointment  Daily at bedtime        01/14/24 1014             Note:  This document was prepared using Dragon voice recognition software and may include unintentional dictation errors.   Brenan Modesto E, PA-C 01/14/24 1426    Suzanne Kirsch, MD 01/14/24 [phone]

## 2024-01-14 NOTE — Discharge Instructions (Signed)
 Please use the antibiotic as prescribed.  Please follow-up with with Dr. Myrna if your symptoms persist.  Please return for any new, worsening, or changing symptoms or other concerns.  It was a pleasure caring for you today.

## 2024-02-18 DIAGNOSIS — J019 Acute sinusitis, unspecified: Secondary | ICD-10-CM | POA: Diagnosis not present

## 2024-02-18 DIAGNOSIS — J45901 Unspecified asthma with (acute) exacerbation: Secondary | ICD-10-CM | POA: Diagnosis not present
